# Patient Record
Sex: Female | Born: 1990 | Race: Black or African American | Hispanic: No | Marital: Single | Smoking: Former smoker
Health system: Southern US, Community
[De-identification: ages and names within clinical notes are randomized; demographics above are authoritative.]

## PROBLEM LIST (undated history)

## (undated) DIAGNOSIS — Z789 Other specified health status: Secondary | ICD-10-CM

## (undated) HISTORY — DX: Other specified health status: Z78.9

## (undated) HISTORY — PX: NO PAST SURGERIES: SHX2092

---

## 2008-01-06 ENCOUNTER — Emergency Department (HOSPITAL_COMMUNITY): Admission: EM | Admit: 2008-01-06 | Discharge: 2008-01-06 | Payer: Self-pay | Admitting: Emergency Medicine

## 2010-01-05 ENCOUNTER — Emergency Department (HOSPITAL_COMMUNITY): Admission: EM | Admit: 2010-01-05 | Discharge: 2010-01-05 | Payer: Self-pay | Admitting: Emergency Medicine

## 2011-05-10 ENCOUNTER — Emergency Department (HOSPITAL_COMMUNITY)
Admission: EM | Admit: 2011-05-10 | Discharge: 2011-05-10 | Discharge status: Against Medical Advice | Payer: Self-pay | Attending: Emergency Medicine | Admitting: Emergency Medicine

## 2011-05-10 ENCOUNTER — Emergency Department (HOSPITAL_COMMUNITY)
Admission: EM | Admit: 2011-05-10 | Discharge: 2011-05-10 | Disposition: A | Discharge status: Home / Self Care | Payer: No Typology Code available for payment source | Attending: Emergency Medicine | Admitting: Emergency Medicine

## 2011-05-10 ENCOUNTER — Encounter: Payer: Self-pay | Admitting: Emergency Medicine

## 2011-05-10 DIAGNOSIS — M62838 Other muscle spasm: Secondary | ICD-10-CM

## 2011-05-10 DIAGNOSIS — Z0389 Encounter for observation for other suspected diseases and conditions ruled out: Secondary | ICD-10-CM | POA: Insufficient documentation

## 2011-05-10 DIAGNOSIS — M542 Cervicalgia: Secondary | ICD-10-CM | POA: Insufficient documentation

## 2011-05-10 DIAGNOSIS — M549 Dorsalgia, unspecified: Secondary | ICD-10-CM | POA: Insufficient documentation

## 2011-05-10 MED ORDER — TRAMADOL HCL 50 MG PO TABS: 50.0000 mg | ORAL_TABLET | Freq: Four times a day (QID) | ORAL | Status: AC | PRN

## 2011-05-10 MED ORDER — DIAZEPAM 5 MG PO TABS: 5.0000 mg | ORAL_TABLET | Freq: Two times a day (BID) | ORAL | Status: AC

## 2011-05-10 MED ORDER — IBUPROFEN 800 MG PO TABS: 800.0000 mg | ORAL_TABLET | Freq: Three times a day (TID) | ORAL | Status: AC

## 2011-05-10 MED ORDER — IBUPROFEN 800 MG PO TABS
800.0000 mg | ORAL_TABLET | Freq: Once | ORAL | Status: AC
  Administered 2011-05-10: 800 mg via ORAL
  Filled 2011-05-10: qty 1

## 2011-05-10 NOTE — ED Provider Notes (Signed)
Evaluation and management procedures were performed by the PA/NP under my supervision/collaboration.   Macio Kissoon, MD 05/10/11 1614 
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
## 2013-05-25 DIAGNOSIS — A549 Gonococcal infection, unspecified: Secondary | ICD-10-CM

## 2013-05-25 HISTORY — DX: Gonococcal infection, unspecified: A54.9

## 2013-11-03 ENCOUNTER — Other Ambulatory Visit: Payer: Self-pay | Admitting: Family Medicine

## 2013-11-03 ENCOUNTER — Other Ambulatory Visit (HOSPITAL_COMMUNITY)
Admission: RE | Admit: 2013-11-03 | Discharge: 2013-11-03 | Disposition: A | Discharge status: Home / Self Care | Payer: BC Managed Care – PPO | Source: Ambulatory Visit | Attending: Family Medicine | Admitting: Family Medicine

## 2013-11-03 DIAGNOSIS — Z0000 Encounter for general adult medical examination without abnormal findings: Secondary | ICD-10-CM | POA: Insufficient documentation

## 2013-11-03 DIAGNOSIS — Z113 Encounter for screening for infections with a predominantly sexual mode of transmission: Secondary | ICD-10-CM | POA: Insufficient documentation

## 2013-11-07 LAB — CYTOLOGY - PAP

## 2014-05-12 ENCOUNTER — Encounter (HOSPITAL_COMMUNITY): Payer: Self-pay

## 2014-05-12 ENCOUNTER — Emergency Department (HOSPITAL_COMMUNITY)
Admission: EM | Admit: 2014-05-12 | Discharge: 2014-05-12 | Disposition: A | Discharge status: Home / Self Care | Payer: BC Managed Care – PPO | Attending: Emergency Medicine | Admitting: Emergency Medicine

## 2014-05-12 ENCOUNTER — Emergency Department (HOSPITAL_COMMUNITY): Payer: BC Managed Care – PPO

## 2014-05-12 DIAGNOSIS — S0990XA Unspecified injury of head, initial encounter: Secondary | ICD-10-CM | POA: Diagnosis not present

## 2014-05-12 DIAGNOSIS — R52 Pain, unspecified: Secondary | ICD-10-CM

## 2014-05-12 DIAGNOSIS — S199XXA Unspecified injury of neck, initial encounter: Secondary | ICD-10-CM | POA: Diagnosis present

## 2014-05-12 DIAGNOSIS — Y998 Other external cause status: Secondary | ICD-10-CM | POA: Insufficient documentation

## 2014-05-12 DIAGNOSIS — Y92410 Unspecified street and highway as the place of occurrence of the external cause: Secondary | ICD-10-CM | POA: Diagnosis not present

## 2014-05-12 DIAGNOSIS — Z87891 Personal history of nicotine dependence: Secondary | ICD-10-CM | POA: Insufficient documentation

## 2014-05-12 DIAGNOSIS — M542 Cervicalgia: Secondary | ICD-10-CM

## 2014-05-12 DIAGNOSIS — Y9389 Activity, other specified: Secondary | ICD-10-CM | POA: Diagnosis not present

## 2014-05-12 DIAGNOSIS — M79671 Pain in right foot: Secondary | ICD-10-CM

## 2014-05-12 DIAGNOSIS — S99921A Unspecified injury of right foot, initial encounter: Secondary | ICD-10-CM | POA: Diagnosis not present

## 2014-05-12 MED ORDER — HYDROCODONE-ACETAMINOPHEN 5-325 MG PO TABS
1.0000 | ORAL_TABLET | Freq: Once | ORAL | Status: AC
  Administered 2014-05-12: 1 via ORAL
  Filled 2014-05-12: qty 1

## 2014-05-12 MED ORDER — IBUPROFEN 800 MG PO TABS: 800.0000 mg | ORAL_TABLET | Freq: Three times a day (TID) | ORAL | Status: DC | PRN

## 2014-05-12 MED ORDER — CYCLOBENZAPRINE HCL 10 MG PO TABS: 10.0000 mg | ORAL_TABLET | Freq: Three times a day (TID) | ORAL | Status: DC | PRN

## 2014-05-12 MED ORDER — HYDROCODONE-ACETAMINOPHEN 5-325 MG PO TABS: 2.0000 | ORAL_TABLET | ORAL | Status: DC | PRN
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
## 2014-05-12 NOTE — Discharge Instructions (Signed)
Read the information below.  Use the prescribed medication as directed.  Please discuss all new medications with your pharmacist.  Do not take additional tylenol while taking the prescribed pain medication to avoid overdose.  You may return to the Emergency Department at any time for worsening condition or any new symptoms that concern you.  If there is any possibility that you might be pregnant, please let your health care provider know and discuss this with the pharmacist to ensure medication safety.   If you develop fevers, loss of control of bowel or bladder, weakness or numbness in your legs, or are unable to walk, return to the ER for a recheck.

## 2014-06-09 ENCOUNTER — Emergency Department (HOSPITAL_COMMUNITY)
Admission: EM | Admit: 2014-06-09 | Discharge: 2014-06-09 | Disposition: A | Discharge status: Home / Self Care | Payer: BLUE CROSS/BLUE SHIELD | Attending: Emergency Medicine | Admitting: Emergency Medicine

## 2014-06-09 ENCOUNTER — Encounter (HOSPITAL_COMMUNITY): Payer: Self-pay | Admitting: Cardiology

## 2014-06-09 DIAGNOSIS — R51 Headache: Secondary | ICD-10-CM | POA: Diagnosis present

## 2014-06-09 DIAGNOSIS — R519 Headache, unspecified: Secondary | ICD-10-CM

## 2014-06-09 DIAGNOSIS — H53149 Visual discomfort, unspecified: Secondary | ICD-10-CM | POA: Diagnosis not present

## 2014-06-09 DIAGNOSIS — Z87891 Personal history of nicotine dependence: Secondary | ICD-10-CM | POA: Insufficient documentation

## 2014-06-09 DIAGNOSIS — M62830 Muscle spasm of back: Secondary | ICD-10-CM | POA: Diagnosis not present

## 2014-06-09 MED ORDER — KETOROLAC TROMETHAMINE 30 MG/ML IJ SOLN
30.0000 mg | Freq: Once | INTRAMUSCULAR | Status: AC
  Administered 2014-06-09: 30 mg via INTRAVENOUS
  Filled 2014-06-09: qty 1

## 2014-06-09 MED ORDER — DIPHENHYDRAMINE HCL 50 MG/ML IJ SOLN
25.0000 mg | Freq: Once | INTRAMUSCULAR | Status: AC
  Administered 2014-06-09: 25 mg via INTRAVENOUS
  Filled 2014-06-09: qty 1

## 2014-06-09 MED ORDER — METOCLOPRAMIDE HCL 5 MG/ML IJ SOLN
10.0000 mg | Freq: Once | INTRAMUSCULAR | Status: AC
  Administered 2014-06-09: 10 mg via INTRAVENOUS
  Filled 2014-06-09: qty 2

## 2014-06-09 MED ORDER — METHOCARBAMOL 500 MG PO TABS: 500.0000 mg | ORAL_TABLET | Freq: Four times a day (QID) | ORAL | Status: DC

## 2014-06-09 NOTE — Discharge Instructions (Signed)
Please read and follow all provided instructions.  Your diagnoses today include:  1. Acute nonintractable headache, unspecified headache type   2. Back spasm     Tests performed today include:  Vital signs. See below for your results today.   Medications:  In the Emergency Department you received:  Reglan - antinausea/headache medication  Benadryl - antihistamine to counteract potential side effects of reglan  Toradol - NSAID medication similar to ibuprofen   Robaxin (methocarbamol) - muscle relaxer medication  DO NOT drive or perform any activities that require you to be awake and alert because this medicine can make you drowsy.   Take any prescribed medications only as directed.  Additional information:  Follow any educational materials contained in this packet.  You are having a headache. No specific cause was found today for your headache. It may have been a migraine or other cause of headache. Stress, anxiety, fatigue, and depression are common triggers for headaches.   Your headache today does not appear to be life-threatening or require hospitalization, but often the exact cause of headaches is not determined in the emergency department. Therefore, follow-up with your doctor is very important to find out what may have caused your headache and whether or not you need any further diagnostic testing or treatment.   Sometimes headaches can appear benign (not harmful), but then more serious symptoms can develop which should prompt an immediate re-evaluation by your doctor or the emergency department.  BE VERY CAREFUL not to take multiple medicines containing Tylenol (also called acetaminophen). Doing so can lead to an overdose which can damage your liver and cause liver failure and possibly death.   Follow-up instructions: Please follow-up with your primary care provider in the next 3 days for further evaluation of your symptoms.   Return instructions:   Please return to  the Emergency Department if you experience worsening symptoms.  Return if the medications do not resolve your headache, if it recurs, or if you have multiple episodes of vomiting or cannot keep down fluids.  Return if you have a change from the usual headache.  RETURN IMMEDIATELY IF you:  Develop a sudden, severe headache  Develop confusion or become poorly responsive or faint  Develop a fever above 100.71F or problem breathing  Have a change in speech, vision, swallowing, or understanding  Develop new weakness, numbness, tingling, incoordination in your arms or legs  Have a seizure  Please return if you have any other emergent concerns.  Additional Information:  Your vital signs today were: BP 123/79 mmHg   Pulse 87   Temp(Src) 97.4 F (36.3 C) (Oral)   Resp 18   SpO2 98% If your blood pressure (BP) was elevated above 135/85 this visit, please have this repeated by your doctor within one month. --------------
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
## 2016-06-13 ENCOUNTER — Encounter (HOSPITAL_COMMUNITY): Payer: Self-pay | Admitting: *Deleted

## 2016-06-13 ENCOUNTER — Ambulatory Visit (HOSPITAL_COMMUNITY)
Admission: EM | Admit: 2016-06-13 | Discharge: 2016-06-13 | Disposition: A | Discharge status: Home / Self Care | Payer: 59 | Attending: Family Medicine | Admitting: Family Medicine

## 2016-06-13 DIAGNOSIS — J111 Influenza due to unidentified influenza virus with other respiratory manifestations: Secondary | ICD-10-CM | POA: Diagnosis not present

## 2016-06-13 DIAGNOSIS — R5383 Other fatigue: Secondary | ICD-10-CM | POA: Diagnosis present

## 2016-06-13 DIAGNOSIS — R509 Fever, unspecified: Secondary | ICD-10-CM | POA: Diagnosis not present

## 2016-06-13 DIAGNOSIS — R52 Pain, unspecified: Secondary | ICD-10-CM | POA: Diagnosis present

## 2016-06-13 LAB — POCT RAPID STREP A: Streptococcus, Group A Screen (Direct): NEGATIVE

## 2016-06-13 MED ORDER — ACETAMINOPHEN 325 MG PO TABS
650.0000 mg | ORAL_TABLET | Freq: Once | ORAL | Status: AC
  Administered 2016-06-13: 650 mg via ORAL

## 2016-06-13 MED ORDER — ACETAMINOPHEN 325 MG PO TABS
ORAL_TABLET | ORAL | Status: AC
  Filled 2016-06-13: qty 2

## 2016-06-13 MED ORDER — OSELTAMIVIR PHOSPHATE 75 MG PO CAPS: 75.0000 mg | ORAL_CAPSULE | Freq: Two times a day (BID) | ORAL | 0 refills | Status: AC

## 2016-06-13 NOTE — ED Provider Notes (Signed)
CSN: [id number]     Arrival date & time 06/13/16  1214 History   First MD Initiated Contact with Patient 06/13/16 1411     No chief complaint on file.  (Consider location/radiation/quality/duration/timing/severity/associated sxs/prior Treatment) Patient is a well-appearing 26 year old female, with no medical history, presents today for flulike symptoms onset this morning. Patient reports a sudden onset of fever, chills, fatigue and body aches. Patient also endorses runny nose, coughing, abdominal pain, nausea and headache.  Her 73-year-old cousin who she lives with at home, was diagnosed with possible fluid and positive strep yesterday. She denies sore throat. Patient denies any alleviating or aggravating factors. Patient have not tired anything OTC to help.       History reviewed. No pertinent past medical history. History reviewed. No pertinent surgical history. History reviewed. No pertinent family history. Social History  Substance Use Topics  . Smoking status: Never Smoker  . Smokeless tobacco: Never Used  . Alcohol use No   OB History    No data available     Review of Systems  Constitutional: Positive for chills, fatigue and fever.  HENT: Positive for rhinorrhea. Negative for congestion, sinus pain, sinus pressure and sore throat.   Respiratory: Positive for cough. Negative for shortness of breath.   Cardiovascular: Negative for chest pain and palpitations.  Gastrointestinal: Positive for abdominal pain and nausea. Negative for vomiting.  Musculoskeletal: Positive for myalgias.  Neurological: Positive for headaches. Negative for dizziness.    Allergies  Patient has no known allergies.  Home Medications   Prior to Admission medications   Medication Sig Start Date End Date Taking? Authorizing Provider  oseltamivir (TAMIFLU) 75 MG capsule Take 1 capsule (75 mg total) by mouth every 12 (twelve) hours. 06/13/16 06/18/16  Lucia Estelle, NP   Meds Ordered and Administered this  Visit   Medications  acetaminophen (TYLENOL) tablet 650 mg (650 mg Oral Given 06/13/16 1357)    BP 115/73 (BP Location: Right Arm)   Pulse 110   Temp 102.8 F (39.3 C) (Oral)   Resp 18   LMP 05/30/2016   SpO2 98%  No data found.   Physical Exam  Constitutional: She is oriented to person, place, and time. She appears well-developed and well-nourished. No distress.  HENT:  Head: Normocephalic and atraumatic.  Right Ear: External ear normal.  Left Ear: External ear normal.  Nose: Nose normal.  Mouth/Throat: Oropharynx is clear and moist. No oropharyngeal exudate.  Unable to visualize the TM due to cerumen impaction bilaterally.  Eyes: Conjunctivae are normal. Pupils are equal, round, and reactive to light.  Neck: Normal range of motion. Neck supple.  Cardiovascular: Normal rate, regular rhythm and normal heart sounds.   Pulmonary/Chest: Effort normal and breath sounds normal. No respiratory distress.  Abdominal: Soft. Bowel sounds are normal.  Some generalized abdominal tenderness present on palpation.  Musculoskeletal: Normal range of motion.  Lymphadenopathy:    She has no cervical adenopathy.  Neurological: She is alert and oriented to person, place, and time.  Skin: Skin is warm and dry.  Nursing note and vitals reviewed.   Urgent Care Course     Procedures (including critical care time)  Labs Review Labs Reviewed  POCT RAPID STREP A    Imaging Review No results found.   MDM   1. Influenza    Rapid strep negative. Believed patient to have influenza. We will treat with Tamiflu twice a day 5 days. Emphasized on the importance of rest and oral hydration. Other over-the-counter treatments  discussed. Follow with PCP if no improvement is noted.    Lucia EstelleFeng Kahley Leib, NP 06/13/16 1454

## 2016-06-15 ENCOUNTER — Encounter (HOSPITAL_COMMUNITY): Payer: Self-pay | Admitting: Cardiology

## 2016-06-16 LAB — CULTURE, GROUP A STREP (THRC)

## 2016-09-24 DIAGNOSIS — R110 Nausea: Secondary | ICD-10-CM | POA: Diagnosis not present

## 2016-09-24 DIAGNOSIS — R1030 Lower abdominal pain, unspecified: Secondary | ICD-10-CM | POA: Diagnosis not present

## 2016-09-24 DIAGNOSIS — R8299 Other abnormal findings in urine: Secondary | ICD-10-CM | POA: Diagnosis not present

## 2016-09-29 DIAGNOSIS — N390 Urinary tract infection, site not specified: Secondary | ICD-10-CM | POA: Diagnosis not present

## 2016-09-29 DIAGNOSIS — Z0000 Encounter for general adult medical examination without abnormal findings: Secondary | ICD-10-CM | POA: Diagnosis not present

## 2016-09-29 DIAGNOSIS — G43909 Migraine, unspecified, not intractable, without status migrainosus: Secondary | ICD-10-CM | POA: Diagnosis not present

## 2016-09-29 DIAGNOSIS — E010 Iodine-deficiency related diffuse (endemic) goiter: Secondary | ICD-10-CM | POA: Diagnosis not present

## 2016-09-29 DIAGNOSIS — Z13220 Encounter for screening for lipoid disorders: Secondary | ICD-10-CM | POA: Diagnosis not present

## 2016-10-22 LAB — OB RESULTS CONSOLE ABO/RH: RH TYPE: POSITIVE

## 2016-10-22 LAB — OB RESULTS CONSOLE HEPATITIS B SURFACE ANTIGEN: HEP B S AG: NEGATIVE

## 2016-10-22 LAB — OB RESULTS CONSOLE RUBELLA ANTIBODY, IGM: RUBELLA: IMMUNE

## 2016-10-22 LAB — OB RESULTS CONSOLE HIV ANTIBODY (ROUTINE TESTING): HIV: NONREACTIVE

## 2016-10-22 LAB — OB RESULTS CONSOLE ANTIBODY SCREEN: Antibody Screen: NEGATIVE

## 2016-10-22 LAB — OB RESULTS CONSOLE RPR: RPR: NONREACTIVE

## 2016-11-17 LAB — OB RESULTS CONSOLE HIV ANTIBODY (ROUTINE TESTING): HIV: NONREACTIVE

## 2016-11-17 LAB — OB RESULTS CONSOLE HEPATITIS B SURFACE ANTIGEN: Hepatitis B Surface Ag: NEGATIVE

## 2016-11-17 LAB — OB RESULTS CONSOLE ABO/RH: RH TYPE: POSITIVE

## 2016-11-17 LAB — OB RESULTS CONSOLE GC/CHLAMYDIA
CHLAMYDIA, DNA PROBE: NEGATIVE
Gonorrhea: NEGATIVE

## 2016-11-17 LAB — OB RESULTS CONSOLE RPR: RPR: NONREACTIVE

## 2016-11-17 LAB — OB RESULTS CONSOLE ANTIBODY SCREEN: Antibody Screen: NEGATIVE

## 2016-11-17 LAB — OB RESULTS CONSOLE RUBELLA ANTIBODY, IGM: Rubella: IMMUNE

## 2016-11-26 DIAGNOSIS — Z87440 Personal history of urinary (tract) infections: Secondary | ICD-10-CM | POA: Diagnosis not present

## 2017-01-22 DIAGNOSIS — Z23 Encounter for immunization: Secondary | ICD-10-CM | POA: Diagnosis not present

## 2017-02-02 DIAGNOSIS — N760 Acute vaginitis: Secondary | ICD-10-CM | POA: Diagnosis not present

## 2017-03-17 ENCOUNTER — Telehealth: Payer: Self-pay

## 2017-03-17 LAB — OB RESULTS CONSOLE GBS: GBS: POSITIVE

## 2017-03-17 NOTE — Telephone Encounter (Signed)
Patient called reporting intermittent dizziness since 1300.  Diet recall shows patient with high fat ([employer] for Breakfast, [employer] for Lunch, Chips and soda for snack), low protein diet.  Instructed to increase protein via snacks and try to eat meals closer together.  Patient further instructed to properly hydrate and that she should be drinking 4 or more 16 oz bottles of water daily.  Patient verbalized understanding.  No other complaints. Instructed to call back if symptoms worsen or other symptoms noted.  JE, CNM

## 2017-03-25 DIAGNOSIS — N760 Acute vaginitis: Secondary | ICD-10-CM | POA: Diagnosis not present

## 2017-04-19 ENCOUNTER — Encounter (HOSPITAL_COMMUNITY): Payer: Self-pay | Admitting: *Deleted

## 2017-04-19 ENCOUNTER — Telehealth (HOSPITAL_COMMUNITY): Payer: Self-pay | Admitting: *Deleted

## 2017-04-19 NOTE — Telephone Encounter (Signed)
Preadmission screen  

## 2017-04-20 ENCOUNTER — Encounter (HOSPITAL_COMMUNITY): Payer: Self-pay | Admitting: *Deleted

## 2017-04-20 ENCOUNTER — Telehealth (HOSPITAL_COMMUNITY): Payer: Self-pay | Admitting: *Deleted

## 2017-04-20 NOTE — Telephone Encounter (Signed)
Preadmission screen  

## 2017-04-21 ENCOUNTER — Other Ambulatory Visit: Payer: Self-pay | Admitting: Obstetrics & Gynecology

## 2017-04-22 ENCOUNTER — Other Ambulatory Visit: Payer: Self-pay | Admitting: Obstetrics & Gynecology

## 2017-04-22 DIAGNOSIS — Z9889 Other specified postprocedural states: Secondary | ICD-10-CM

## 2017-04-22 NOTE — H&P (Signed)
HPI: 26 y/o G1P0 @ 10737w1d estimated gestational age (as dated by LMP c/w 20 week ultrasound) presents for scheduled IOL.   no Leaking of Fluid,   no Vaginal Bleeding,   no Uterine Contractions,  + Fetal Movement.  Prenatal care has been provided by Dr. Charlotta Newtonzan  ROS: no HA, no epigastric pain, no visual changes.    Pregnancy complicated by: 1) Anemia- on iron twice daily Last Hgb 9.6 (01/09/2017)  2) GBS positive- pt to have PCN in labor   Prenatal Transfer Tool  Maternal Diabetes: No Genetic Screening: Normal Maternal Ultrasounds/Referrals: Normal Fetal Ultrasounds or other Referrals:  None Maternal Substance Abuse:  No Significant Maternal Medications:  None Significant Maternal Lab Results: Lab values include: Group B Strep positive   PNL:  GBS positive, Rub Immune, Hep B neg, RPR NR, HIV neg, GC/C neg, glucola:normal Hgb: 9.6 Blood type: O positive, antibody neg  Immunizations: Tdap: 8/31 Flu: declined  OBHx: primip PMHx:  anemia Meds:  PNV, iron Allergy:  No Known Allergies SurgHx: none SocHx:   no Tobacco, no  EtOH, no Illicit Drugs  O: LMP 05/30/2016  Gen. AAOx3, NAD CV.  RRR  No murmur.  Resp. CTAB, no wheeze or crackles. Abd. Gravid,  no tenderness,  no rigidity,  no guarding Extr.  1+ edema B/L , no calf tenderness, neg Homan's B/L  FHT: reassuring by doppler and BPP in office on 11/29 SVE: closed/soft/-3, vertex   Labs: see orders  A/P:  26 y.o. G1P0 @ 6837w1d EGA who presents for scheduled IOL for postdates -FWB:  NICHD Cat I FHTs -Labor: plan for cytotec per protocol -GBS: positive, plan for PCN when in active labor or with rupture of membranes -Pain management: IV or epidural upon request -Anemia: CBC to be obtained  Myna HidalgoJennifer Daysi Boggan, DO [phone] (pager) [phone] (office)

## 2017-04-24 ENCOUNTER — Encounter (HOSPITAL_COMMUNITY): Payer: Self-pay

## 2017-04-24 ENCOUNTER — Encounter (HOSPITAL_COMMUNITY): Payer: Self-pay | Admitting: Certified Registered Nurse Anesthetist

## 2017-04-24 ENCOUNTER — Encounter (HOSPITAL_COMMUNITY)
Admission: RE | Disposition: A | Discharge status: Home / Self Care | Payer: Self-pay | Source: Ambulatory Visit | Attending: Obstetrics & Gynecology

## 2017-04-24 ENCOUNTER — Inpatient Hospital Stay (HOSPITAL_COMMUNITY): Payer: 59 | Admitting: Anesthesiology

## 2017-04-24 ENCOUNTER — Inpatient Hospital Stay (HOSPITAL_COMMUNITY)
Admission: RE | Admit: 2017-04-24 | Discharge: 2017-04-28 | DRG: 788 | Disposition: A | Discharge status: Home / Self Care | Payer: 59 | Source: Ambulatory Visit | Attending: Obstetrics & Gynecology | Admitting: Obstetrics & Gynecology

## 2017-04-24 DIAGNOSIS — Z3A41 41 weeks gestation of pregnancy: Secondary | ICD-10-CM | POA: Diagnosis not present

## 2017-04-24 DIAGNOSIS — O99824 Streptococcus B carrier state complicating childbirth: Secondary | ICD-10-CM | POA: Diagnosis present

## 2017-04-24 DIAGNOSIS — O9902 Anemia complicating childbirth: Secondary | ICD-10-CM | POA: Diagnosis present

## 2017-04-24 DIAGNOSIS — O480 Post-term pregnancy: Secondary | ICD-10-CM | POA: Diagnosis present

## 2017-04-24 DIAGNOSIS — D649 Anemia, unspecified: Secondary | ICD-10-CM | POA: Diagnosis present

## 2017-04-24 LAB — CBC
HEMATOCRIT: 34.5 % — AB (ref 36.0–46.0)
Hemoglobin: 11.6 g/dL — ABNORMAL LOW (ref 12.0–15.0)
MCH: 33.2 pg (ref 26.0–34.0)
MCHC: 33.6 g/dL (ref 30.0–36.0)
MCV: 98.9 fL (ref 78.0–100.0)
PLATELETS: 225 10*3/uL (ref 150–400)
RBC: 3.49 MIL/uL — ABNORMAL LOW (ref 3.87–5.11)
RDW: 15 % (ref 11.5–15.5)
WBC: 9.3 10*3/uL (ref 4.0–10.5)

## 2017-04-24 LAB — ABO/RH: ABO/RH(D): O POS

## 2017-04-24 LAB — TYPE AND SCREEN
ABO/RH(D): O POS
ANTIBODY SCREEN: NEGATIVE

## 2017-04-24 LAB — RPR: RPR Ser Ql: NONREACTIVE

## 2017-04-24 SURGERY — Surgical Case
Anesthesia: Epidural | Site: Abdomen | Wound class: Clean Contaminated

## 2017-04-24 MED ORDER — LACTATED RINGERS IV SOLN
INTRAVENOUS | Status: DC
  Administered 2017-04-24 (×4): via INTRAVENOUS

## 2017-04-24 MED ORDER — ONDANSETRON HCL 4 MG/2ML IJ SOLN
4.0000 mg | Freq: Four times a day (QID) | INTRAMUSCULAR | Status: DC | PRN
  Administered 2017-04-24: 4 mg via INTRAVENOUS
  Filled 2017-04-24: qty 2

## 2017-04-24 MED ORDER — OXYCODONE-ACETAMINOPHEN 5-325 MG PO TABS: 1.0000 | ORAL_TABLET | ORAL | Status: DC | PRN

## 2017-04-24 MED ORDER — EPHEDRINE 5 MG/ML INJ: 10.0000 mg | INTRAVENOUS | Status: DC | PRN

## 2017-04-24 MED ORDER — PHENYLEPHRINE 40 MCG/ML (10ML) SYRINGE FOR IV PUSH (FOR BLOOD PRESSURE SUPPORT): 80.0000 ug | PREFILLED_SYRINGE | INTRAVENOUS | Status: DC | PRN

## 2017-04-24 MED ORDER — DIPHENHYDRAMINE HCL 50 MG/ML IJ SOLN: 12.5000 mg | INTRAMUSCULAR | Status: DC | PRN

## 2017-04-24 MED ORDER — PENICILLIN G POTASSIUM 5000000 UNITS IJ SOLR
5.0000 10*6.[IU] | Freq: Once | INTRAVENOUS | Status: AC
  Administered 2017-04-24: 5 10*6.[IU] via INTRAVENOUS
  Filled 2017-04-24: qty 5

## 2017-04-24 MED ORDER — OXYCODONE-ACETAMINOPHEN 5-325 MG PO TABS: 2.0000 | ORAL_TABLET | ORAL | Status: DC | PRN

## 2017-04-24 MED ORDER — LACTATED RINGERS IV SOLN
500.0000 mL | Freq: Once | INTRAVENOUS | Status: AC
  Administered 2017-04-24: 500 mL via INTRAVENOUS

## 2017-04-24 MED ORDER — PHENYLEPHRINE 40 MCG/ML (10ML) SYRINGE FOR IV PUSH (FOR BLOOD PRESSURE SUPPORT)
80.0000 ug | PREFILLED_SYRINGE | INTRAVENOUS | Status: DC | PRN
  Filled 2017-04-24: qty 10

## 2017-04-24 MED ORDER — LIDOCAINE HCL (PF) 1 % IJ SOLN: 30.0000 mL | INTRAMUSCULAR | Status: DC | PRN

## 2017-04-24 MED ORDER — TERBUTALINE SULFATE 1 MG/ML IJ SOLN: 0.2500 mg | Freq: Once | INTRAMUSCULAR | Status: DC | PRN

## 2017-04-24 MED ORDER — PENICILLIN G POT IN DEXTROSE 60000 UNIT/ML IV SOLN
3.0000 10*6.[IU] | INTRAVENOUS | Status: DC
  Administered 2017-04-24 (×2): 3 10*6.[IU] via INTRAVENOUS
  Filled 2017-04-24 (×6): qty 50

## 2017-04-24 MED ORDER — OXYTOCIN BOLUS FROM INFUSION: 500.0000 mL | Freq: Once | INTRAVENOUS | Status: DC

## 2017-04-24 MED ORDER — OXYTOCIN 40 UNITS IN LACTATED RINGERS INFUSION - SIMPLE MED
2.5000 [IU]/h | INTRAVENOUS | Status: DC
  Filled 2017-04-24: qty 1000

## 2017-04-24 MED ORDER — TERBUTALINE SULFATE 1 MG/ML IJ SOLN
0.2500 mg | Freq: Once | INTRAMUSCULAR | Status: DC | PRN
Start: 2017-04-24 — End: 2017-04-25

## 2017-04-24 MED ORDER — OXYTOCIN 40 UNITS IN LACTATED RINGERS INFUSION - SIMPLE MED
1.0000 m[IU]/min | INTRAVENOUS | Status: DC
  Administered 2017-04-24: 2 m[IU]/min via INTRAVENOUS

## 2017-04-24 MED ORDER — FENTANYL 2.5 MCG/ML BUPIVACAINE 1/10 % EPIDURAL INFUSION (WH - ANES)
14.0000 mL/h | INTRAMUSCULAR | Status: DC | PRN
  Administered 2017-04-24: 14 mL/h via EPIDURAL
  Filled 2017-04-24: qty 100

## 2017-04-24 MED ORDER — LACTATED RINGERS IV SOLN: 500.0000 mL | Freq: Once | INTRAVENOUS | Status: DC

## 2017-04-24 MED ORDER — SOD CITRATE-CITRIC ACID 500-334 MG/5ML PO SOLN
30.0000 mL | ORAL | Status: DC | PRN
  Administered 2017-04-24: 30 mL via ORAL
  Filled 2017-04-24: qty 15

## 2017-04-24 MED ORDER — CEFAZOLIN SODIUM-DEXTROSE 2-4 GM/100ML-% IV SOLN: 2.0000 g | Freq: Once | INTRAVENOUS | Status: DC

## 2017-04-24 MED ORDER — LIDOCAINE HCL (PF) 1 % IJ SOLN
INTRAMUSCULAR | Status: DC | PRN
  Administered 2017-04-24 (×2): 5 mL via EPIDURAL

## 2017-04-24 MED ORDER — MISOPROSTOL 25 MCG QUARTER TABLET
25.0000 ug | ORAL_TABLET | ORAL | Status: DC | PRN
  Administered 2017-04-24 (×2): 25 ug via VAGINAL
  Filled 2017-04-24 (×3): qty 1

## 2017-04-24 MED ORDER — ACETAMINOPHEN 325 MG PO TABS: 650.0000 mg | ORAL_TABLET | ORAL | Status: DC | PRN

## 2017-04-24 MED ORDER — LACTATED RINGERS IV SOLN: 500.0000 mL | INTRAVENOUS | Status: DC | PRN

## 2017-04-24 SURGICAL SUPPLY — 37 items
BARRIER ADHS 3X4 INTERCEED (GAUZE/BANDAGES/DRESSINGS) ×2 IMPLANT
BENZOIN TINCTURE PRP APPL 2/3 (GAUZE/BANDAGES/DRESSINGS) ×2 IMPLANT
CHLORAPREP W/TINT 26ML (MISCELLANEOUS) ×2 IMPLANT
CLAMP CORD UMBIL (MISCELLANEOUS) IMPLANT
CLOTH BEACON ORANGE TIMEOUT ST (SAFETY) ×2 IMPLANT
DERMABOND ADVANCED (GAUZE/BANDAGES/DRESSINGS)
DERMABOND ADVANCED .7 DNX12 (GAUZE/BANDAGES/DRESSINGS) IMPLANT
DRSG OPSITE POSTOP 4X10 (GAUZE/BANDAGES/DRESSINGS) ×2 IMPLANT
ELECT REM PT RETURN 9FT ADLT (ELECTROSURGICAL) ×2
ELECTRODE REM PT RTRN 9FT ADLT (ELECTROSURGICAL) ×1 IMPLANT
EXTRACTOR VACUUM KIWI (MISCELLANEOUS) IMPLANT
GLOVE BIOGEL PI IND STRL 6.5 (GLOVE) ×1 IMPLANT
GLOVE BIOGEL PI IND STRL 7.0 (GLOVE) ×2 IMPLANT
GLOVE BIOGEL PI INDICATOR 6.5 (GLOVE) ×1
GLOVE BIOGEL PI INDICATOR 7.0 (GLOVE) ×2
GLOVE ECLIPSE 6.5 STRL STRAW (GLOVE) ×2 IMPLANT
GOWN STRL REUS W/TWL LRG LVL3 (GOWN DISPOSABLE) ×6 IMPLANT
KIT ABG SYR 3ML LUER SLIP (SYRINGE) IMPLANT
NEEDLE HYPO 25X5/8 SAFETYGLIDE (NEEDLE) IMPLANT
NS IRRIG 1000ML POUR BTL (IV SOLUTION) ×2 IMPLANT
PACK C SECTION WH (CUSTOM PROCEDURE TRAY) ×2 IMPLANT
PAD ABD 7.5X8 STRL (GAUZE/BANDAGES/DRESSINGS) ×2 IMPLANT
PAD OB MATERNITY 4.3X12.25 (PERSONAL CARE ITEMS) ×2 IMPLANT
PENCIL SMOKE EVAC W/HOLSTER (ELECTROSURGICAL) ×2 IMPLANT
RTRCTR C-SECT PINK 25CM LRG (MISCELLANEOUS) ×2 IMPLANT
STRIP CLOSURE SKIN 1/2X4 (GAUZE/BANDAGES/DRESSINGS) ×2 IMPLANT
SUT PLAIN 0 NONE (SUTURE) IMPLANT
SUT PLAIN 2 0 XLH (SUTURE) IMPLANT
SUT VIC AB 0 CT1 27 (SUTURE) ×2
SUT VIC AB 0 CT1 27XBRD ANBCTR (SUTURE) ×2 IMPLANT
SUT VIC AB 0 CTX 36 (SUTURE) ×3
SUT VIC AB 0 CTX36XBRD ANBCTRL (SUTURE) ×3 IMPLANT
SUT VIC AB 2-0 CT1 27 (SUTURE) ×1
SUT VIC AB 2-0 CT1 TAPERPNT 27 (SUTURE) ×1 IMPLANT
SUT VIC AB 4-0 KS 27 (SUTURE) ×2 IMPLANT
TOWEL OR 17X24 6PK STRL BLUE (TOWEL DISPOSABLE) ×2 IMPLANT
TRAY FOLEY BAG SILVER LF 14FR (SET/KITS/TRAYS/PACK) IMPLANT

## 2017-04-24 NOTE — Anesthesia Pain Management Evaluation Note (Signed)
  CRNA Pain Management Visit Note  Patient: Sharon Jimenez, 26 y.o., female  "Hello I am a member of the anesthesia team at [facility]. We have an anesthesia team available at all times to provide care throughout the hospital, including epidural management and anesthesia for C-section. I don't know your plan for the delivery whether it a natural birth, water birth, IV sedation, nitrous supplementation, doula or epidural, but we want to meet your pain goals."   1.Was your pain managed to your expectations on prior hospitalizations?   No prior hospitalizations  2.What is your expectation for pain management during this hospitalization?     Epidural  3.How can we help you reach that goal?   Record the patient's initial score and the patient's pain goal.   Pain: 4  Pain Goal: 6 [facility] wants you to be able to say your pain was always managed very well.  Sharon Jimenez,Sharon Jimenez 04/24/2017

## 2017-04-24 NOTE — Progress Notes (Signed)
OB PN:  S: Pt resting comfortably, no acute complaints  O: BP 119/84   Pulse 76   Temp 98.4 F (36.9 C) (Oral)   Resp 16   Ht 5\' 5"  (1.651 m)   Wt 83.5 kg (184 lb)   LMP 05/30/2016   SpO2 99%   BMI 30.62 kg/m   FHT: 125bpm, moderate variablity, + accels, no decels Toco: q2-715min SVE: 2/50/-3  Results for orders placed or performed during the hospital encounter of 04/24/17 (from the past 24 hour(s))  CBC     Status: Abnormal   Collection Time: 04/24/17 12:35 AM  Result Value Ref Range   WBC 9.3 4.0 - 10.5 K/uL   RBC 3.49 (L) 3.87 - 5.11 MIL/uL   Hemoglobin 11.6 (L) 12.0 - 15.0 g/dL   HCT 62.134.5 (L) 30.836.0 - 65.746.0 %   MCV 98.9 78.0 - 100.0 fL   MCH 33.2 26.0 - 34.0 pg   MCHC 33.6 30.0 - 36.0 g/dL   RDW 84.615.0 96.211.5 - 95.215.5 %   Platelets 225 150 - 400 K/uL  Type and screen     Status: None   Collection Time: 04/24/17 12:35 AM  Result Value Ref Range   ABO/RH(D) O POS    Antibody Screen NEG    Sample Expiration 04/27/2017   ABO/Rh     Status: None   Collection Time: 04/24/17 12:35 AM  Result Value Ref Range   ABO/RH(D) O POS     A/P: 26 y.o. G1P0 @ 4639w1d for IOL 1. FWB: Cat. I 2. Labor: plan to transition to Pitocin Pain: IV or epidural upon request GBS: positive, plan to start PCN  Myna HidalgoJennifer Maycen Degregory, DO [phone] (pager) [phone] (office)

## 2017-04-24 NOTE — Anesthesia Preprocedure Evaluation (Signed)
Anesthesia Evaluation  Patient identified by MRN, date of birth, ID band Patient awake    Reviewed: Allergy & Precautions, H&P , NPO status , Patient's Chart, lab work & pertinent test results  Airway Mallampati: II   Neck ROM: full    Dental   Pulmonary neg pulmonary ROS,    breath sounds clear to auscultation       Cardiovascular negative cardio ROS   Rhythm:regular Rate:Normal     Neuro/Psych    GI/Hepatic   Endo/Other    Renal/GU      Musculoskeletal   Abdominal   Peds  Hematology   Anesthesia Other Findings   Reproductive/Obstetrics (+) Pregnancy                             Anesthesia Physical Anesthesia Plan  ASA: I  Anesthesia Plan: Epidural   Post-op Pain Management:    Induction: Intravenous  PONV Risk Score and Plan: 2 and Treatment may vary due to age or medical condition  Airway Management Planned: Natural Airway  Additional Equipment:   Intra-op Plan:   Post-operative Plan:   Informed Consent: I have reviewed the patients History and Physical, chart, labs and discussed the procedure including the risks, benefits and alternatives for the proposed anesthesia with the patient or authorized representative who has indicated his/her understanding and acceptance.       Plan Discussed with: Anesthesiologist  Anesthesia Plan Comments:         Anesthesia Quick Evaluation  

## 2017-04-24 NOTE — Progress Notes (Signed)
OB PN:  S: Pt resting comfortably- starting to feel more regular contractions.  Rates her pain 5/10, no acute complaints  O: BP 122/73   Pulse 72   Temp 98.4 F (36.9 C) (Oral)   Resp 16   Ht 5\' 5"  (1.651 m)   Wt 83.5 kg (184 lb)   LMP 05/30/2016   SpO2 99%   BMI 30.62 kg/m   FHT: 130 bpm, moderate variablity, + accels, no decels Toco: q2-403min SVE: 3/70/-2, AROM clear fluid  A/P: 26 y.o. G1P0 @ 755w1d for IOL 1. FWB: Cat. I 2. Labor: continue pit per protocol Pain: IV or epidural upon request GBS: positive, continue PCN per protocol  Myna HidalgoJennifer Kaleigh Spiegelman, DO [phone] (pager) [phone] (office)

## 2017-04-24 NOTE — Anesthesia Procedure Notes (Signed)
Epidural Patient location during procedure: OB Start time: 04/24/2017 6:24 PM End time: 04/24/2017 6:35 PM  Staffing Anesthesiologist: Achille RichHodierne, Marlie Kuennen, MD Performed: anesthesiologist   Preanesthetic Checklist Completed: patient identified, site marked, pre-op evaluation, timeout performed, IV checked, risks and benefits discussed and monitors and equipment checked  Epidural Patient position: sitting Prep: DuraPrep Patient monitoring: heart rate, cardiac monitor, continuous pulse ox and blood pressure Approach: midline Location: L2-L3 Injection technique: LOR saline  Needle:  Needle type: Tuohy  Needle gauge: 17 G Needle length: 9 cm Needle insertion depth: 7 cm Catheter type: closed end flexible Catheter size: 19 Gauge Catheter at skin depth: 12 cm Test dose: negative and Other  Assessment Events: blood not aspirated, injection not painful, no injection resistance and negative IV test  Additional Notes Informed consent obtained prior to proceeding including risk of failure, 1% risk of PDPH, risk of minor discomfort and bruising.  Discussed rare but serious complications including epidural abscess, permanent nerve injury, epidural hematoma.  Discussed alternatives to epidural analgesia and patient desires to proceed.  Timeout performed pre-procedure verifying patient name, procedure, and platelet count.  Patient tolerated procedure well. Reason for block:procedure for pain

## 2017-04-24 NOTE — Progress Notes (Signed)
OB PN:  S: Pt resting comfortably with epidural  O: BP 126/62   Pulse 83   Temp 98.8 F (37.1 C) (Oral)   Resp 18   Ht 5\' 5"  (1.651 m)   Wt 83.5 kg (184 lb)   LMP 05/30/2016   SpO2 100%   BMI 30.62 kg/m   FHT: 130 bpm, moderate variablity, + accels, initially early decels noted now late decels x3 Toco: q2-223min SVE: 3-4/90/-2, concern for asynclitic presentation, IUPC placed  A/P: 26 y.o. G1P0 @ 6157w1d for IOL 1. FWB: Cat. II- pt repositioned, Pit discontinued  2. Labor: Pitocin off, due to FHT.  Discussed concern for arrest of dilation as pt has made no further cervical dilation since 12pm and currently with Pitocin, pt noted to have concerning decels.  Discussed options including primary C-section vs continuing labor.  Pt wishes to wait to see if adequate contraction and further cervical dilation for another hour before proceed with C-section.  Discussed that labor may require Pitocin which may cause fetal intolerance to labor.  Pt voiced understanding.  Plan to closely monitor Pain: continue epidural GBS: positive, continue PCN per protocol  Myna HidalgoJennifer Rachelann Enloe, DO [phone] (pager) [phone] (office)

## 2017-04-24 NOTE — Progress Notes (Signed)
At bedside to discuss care- Risk benefits and alternatives of cesarean section were discussed with the patient including but not limited to infection, bleeding, damage to bowel , bladder and baby with the need for further surgery. Pt voiced understanding and desires to proceed. Plan for primary C-section

## 2017-04-25 ENCOUNTER — Encounter (HOSPITAL_COMMUNITY): Payer: Self-pay | Admitting: Obstetrics & Gynecology

## 2017-04-25 LAB — CBC
HCT: 36.1 % (ref 36.0–46.0)
Hemoglobin: 11.8 g/dL — ABNORMAL LOW (ref 12.0–15.0)
MCH: 32.8 pg (ref 26.0–34.0)
MCHC: 32.7 g/dL (ref 30.0–36.0)
MCV: 100.3 fL — AB (ref 78.0–100.0)
PLATELETS: 220 10*3/uL (ref 150–400)
RBC: 3.6 MIL/uL — ABNORMAL LOW (ref 3.87–5.11)
RDW: 14.9 % (ref 11.5–15.5)
WBC: 16.4 10*3/uL — ABNORMAL HIGH (ref 4.0–10.5)

## 2017-04-25 MED ORDER — ZOLPIDEM TARTRATE 5 MG PO TABS: 5.0000 mg | ORAL_TABLET | Freq: Every evening | ORAL | Status: DC | PRN

## 2017-04-25 MED ORDER — SENNOSIDES-DOCUSATE SODIUM 8.6-50 MG PO TABS
2.0000 | ORAL_TABLET | ORAL | Status: DC
  Administered 2017-04-25 – 2017-04-28 (×3): 2 via ORAL
  Filled 2017-04-25 (×4): qty 2

## 2017-04-25 MED ORDER — OXYTOCIN 10 UNIT/ML IJ SOLN
INTRAMUSCULAR | Status: AC
  Filled 2017-04-25: qty 4

## 2017-04-25 MED ORDER — FENTANYL CITRATE (PF) 100 MCG/2ML IJ SOLN: 25.0000 ug | INTRAMUSCULAR | Status: DC | PRN

## 2017-04-25 MED ORDER — DIBUCAINE 1 % RE OINT: 1.0000 "application " | TOPICAL_OINTMENT | RECTAL | Status: DC | PRN

## 2017-04-25 MED ORDER — DIPHENHYDRAMINE HCL 25 MG PO CAPS: 25.0000 mg | ORAL_CAPSULE | Freq: Four times a day (QID) | ORAL | Status: DC | PRN

## 2017-04-25 MED ORDER — WITCH HAZEL-GLYCERIN EX PADS: 1.0000 "application " | MEDICATED_PAD | CUTANEOUS | Status: DC | PRN

## 2017-04-25 MED ORDER — SIMETHICONE 80 MG PO CHEW
80.0000 mg | CHEWABLE_TABLET | Freq: Three times a day (TID) | ORAL | Status: DC
  Administered 2017-04-25 – 2017-04-27 (×7): 80 mg via ORAL
  Filled 2017-04-25 (×7): qty 1

## 2017-04-25 MED ORDER — ONDANSETRON HCL 4 MG/2ML IJ SOLN: 4.0000 mg | Freq: Four times a day (QID) | INTRAMUSCULAR | Status: DC | PRN

## 2017-04-25 MED ORDER — OXYCODONE HCL 5 MG PO TABS: 5.0000 mg | ORAL_TABLET | ORAL | Status: DC | PRN

## 2017-04-25 MED ORDER — METOCLOPRAMIDE HCL 5 MG/ML IJ SOLN
INTRAMUSCULAR | Status: AC
Start: 2017-04-25 — End: ?
  Filled 2017-04-25: qty 2

## 2017-04-25 MED ORDER — DEXAMETHASONE SODIUM PHOSPHATE 10 MG/ML IJ SOLN
INTRAMUSCULAR | Status: DC | PRN
  Administered 2017-04-25: 10 mg via INTRAVENOUS

## 2017-04-25 MED ORDER — METOCLOPRAMIDE HCL 5 MG/ML IJ SOLN
INTRAMUSCULAR | Status: DC | PRN
  Administered 2017-04-25: 10 mg via INTRAVENOUS

## 2017-04-25 MED ORDER — COCONUT OIL OIL: 1.0000 "application " | TOPICAL_OIL | Status: DC | PRN

## 2017-04-25 MED ORDER — DEXAMETHASONE SODIUM PHOSPHATE 10 MG/ML IJ SOLN
INTRAMUSCULAR | Status: AC
  Filled 2017-04-25: qty 1

## 2017-04-25 MED ORDER — SODIUM CHLORIDE 0.9% FLUSH: 3.0000 mL | INTRAVENOUS | Status: DC | PRN

## 2017-04-25 MED ORDER — MENTHOL 3 MG MT LOZG: 1.0000 | LOZENGE | OROMUCOSAL | Status: DC | PRN

## 2017-04-25 MED ORDER — ONDANSETRON HCL 4 MG/2ML IJ SOLN
INTRAMUSCULAR | Status: AC
  Filled 2017-04-25: qty 2

## 2017-04-25 MED ORDER — DIPHENHYDRAMINE HCL 50 MG/ML IJ SOLN
INTRAMUSCULAR | Status: AC
  Filled 2017-04-25: qty 1

## 2017-04-25 MED ORDER — SCOPOLAMINE 1 MG/3DAYS TD PT72
MEDICATED_PATCH | TRANSDERMAL | Status: AC
  Filled 2017-04-25: qty 1

## 2017-04-25 MED ORDER — PRENATAL MULTIVITAMIN CH
1.0000 | ORAL_TABLET | Freq: Every day | ORAL | Status: DC
  Administered 2017-04-25 – 2017-04-27 (×3): 1 via ORAL
  Filled 2017-04-25 (×3): qty 1

## 2017-04-25 MED ORDER — MEPERIDINE HCL 25 MG/ML IJ SOLN
INTRAMUSCULAR | Status: AC
  Filled 2017-04-25: qty 1

## 2017-04-25 MED ORDER — NALOXONE HCL 0.4 MG/ML IJ SOLN: 0.4000 mg | INTRAMUSCULAR | Status: DC | PRN

## 2017-04-25 MED ORDER — MORPHINE SULFATE (PF) 0.5 MG/ML IJ SOLN
INTRAMUSCULAR | Status: DC | PRN
  Administered 2017-04-25: 4 mg via EPIDURAL

## 2017-04-25 MED ORDER — ACETAMINOPHEN 325 MG PO TABS: 650.0000 mg | ORAL_TABLET | ORAL | Status: DC | PRN

## 2017-04-25 MED ORDER — IBUPROFEN 600 MG PO TABS
600.0000 mg | ORAL_TABLET | Freq: Four times a day (QID) | ORAL | Status: DC
  Administered 2017-04-25 – 2017-04-28 (×13): 600 mg via ORAL
  Filled 2017-04-25 (×13): qty 1

## 2017-04-25 MED ORDER — OXYCODONE HCL 5 MG/5ML PO SOLN: 5.0000 mg | Freq: Once | ORAL | Status: DC | PRN

## 2017-04-25 MED ORDER — LACTATED RINGERS IV SOLN: INTRAVENOUS | Status: DC

## 2017-04-25 MED ORDER — MORPHINE SULFATE (PF) 0.5 MG/ML IJ SOLN
INTRAMUSCULAR | Status: AC
  Filled 2017-04-25: qty 10

## 2017-04-25 MED ORDER — LIDOCAINE-EPINEPHRINE 2 %-1:100000 IJ SOLN
INTRAMUSCULAR | Status: DC | PRN
  Administered 2017-04-25 (×2): 5 mL via INTRADERMAL

## 2017-04-25 MED ORDER — DIPHENHYDRAMINE HCL 50 MG/ML IJ SOLN: 12.5000 mg | INTRAMUSCULAR | Status: DC | PRN

## 2017-04-25 MED ORDER — KETOROLAC TROMETHAMINE 30 MG/ML IJ SOLN: 30.0000 mg | Freq: Four times a day (QID) | INTRAMUSCULAR | Status: AC | PRN

## 2017-04-25 MED ORDER — SIMETHICONE 80 MG PO CHEW
80.0000 mg | CHEWABLE_TABLET | ORAL | Status: DC
  Administered 2017-04-25 – 2017-04-28 (×3): 80 mg via ORAL
  Filled 2017-04-25 (×3): qty 1

## 2017-04-25 MED ORDER — OXYCODONE HCL 5 MG PO TABS: 5.0000 mg | ORAL_TABLET | Freq: Once | ORAL | Status: DC | PRN

## 2017-04-25 MED ORDER — SIMETHICONE 80 MG PO CHEW: 80.0000 mg | CHEWABLE_TABLET | ORAL | Status: DC | PRN

## 2017-04-25 MED ORDER — SCOPOLAMINE 1 MG/3DAYS TD PT72: 1.0000 | MEDICATED_PATCH | Freq: Once | TRANSDERMAL | Status: DC

## 2017-04-25 MED ORDER — NALOXONE HCL 0.4 MG/ML IJ SOLN
1.0000 ug/kg/h | INTRAVENOUS | Status: DC | PRN
  Filled 2017-04-25: qty 5

## 2017-04-25 MED ORDER — CEFAZOLIN SODIUM-DEXTROSE 2-3 GM-%(50ML) IV SOLR
INTRAVENOUS | Status: AC
  Filled 2017-04-25: qty 50

## 2017-04-25 MED ORDER — DIPHENHYDRAMINE HCL 25 MG PO CAPS
25.0000 mg | ORAL_CAPSULE | ORAL | Status: DC | PRN
  Filled 2017-04-25: qty 1

## 2017-04-25 MED ORDER — NALBUPHINE HCL 10 MG/ML IJ SOLN: 5.0000 mg | INTRAMUSCULAR | Status: DC | PRN

## 2017-04-25 MED ORDER — DIPHENHYDRAMINE HCL 50 MG/ML IJ SOLN
INTRAMUSCULAR | Status: DC | PRN
  Administered 2017-04-25: 25 mg via INTRAVENOUS

## 2017-04-25 MED ORDER — OXYCODONE HCL 5 MG PO TABS: 10.0000 mg | ORAL_TABLET | ORAL | Status: DC | PRN

## 2017-04-25 MED ORDER — OXYTOCIN 40 UNITS IN LACTATED RINGERS INFUSION - SIMPLE MED: 2.5000 [IU]/h | INTRAVENOUS | Status: AC

## 2017-04-25 MED ORDER — NALBUPHINE HCL 10 MG/ML IJ SOLN: 5.0000 mg | Freq: Once | INTRAMUSCULAR | Status: DC | PRN

## 2017-04-25 MED ORDER — ONDANSETRON HCL 4 MG/2ML IJ SOLN
INTRAMUSCULAR | Status: DC | PRN
  Administered 2017-04-25: 4 mg via INTRAVENOUS

## 2017-04-25 MED ORDER — ONDANSETRON HCL 4 MG/2ML IJ SOLN: 4.0000 mg | Freq: Three times a day (TID) | INTRAMUSCULAR | Status: DC | PRN

## 2017-04-25 MED ORDER — MEPERIDINE HCL 25 MG/ML IJ SOLN: 6.2500 mg | INTRAMUSCULAR | Status: DC | PRN

## 2017-04-25 MED ORDER — LACTATED RINGERS IV SOLN
INTRAVENOUS | Status: DC | PRN
  Administered 2017-04-25 (×3): via INTRAVENOUS

## 2017-04-25 MED ORDER — MEPERIDINE HCL 25 MG/ML IJ SOLN
INTRAMUSCULAR | Status: DC | PRN
  Administered 2017-04-25 (×2): 12.5 mg via INTRAVENOUS

## 2017-04-25 NOTE — Anesthesia Postprocedure Evaluation (Signed)
Anesthesia Post Note  Patient: Orland Decykeria L Sandy  Procedure(s) Performed: CESAREAN SECTION (N/A Abdomen)     Patient location during evaluation: Mother Baby Anesthesia Type: Epidural Level of consciousness: awake and alert and oriented Pain management: pain level controlled Vital Signs Assessment: post-procedure vital signs reviewed and stable Respiratory status: spontaneous breathing and nonlabored ventilation Cardiovascular status: stable Postop Assessment: no headache, adequate PO intake, no backache, epidural receding, patient able to bend at knees and no apparent nausea or vomiting Anesthetic complications: no    Last Vitals:  Vitals:   04/25/17 0430 04/25/17 0530  BP: (!) 157/90   Pulse: 60   Resp: 16 18  Temp: 37.1 C 37.2 C  SpO2: 98% 95%    Last Pain:  Vitals:   04/25/17 0600  TempSrc:   PainSc: 0-No pain   Pain Goal: Patients Stated Pain Goal: 0 (04/25/17 0235)               Laban EmperorMalinova,Isayah Ignasiak Hristova

## 2017-04-25 NOTE — Op Note (Signed)
PreOp Diagnosis:  -Intrauterine pregnancy @ 5814w2d -Failed induction of labor -Arrest of dilation -Fetal intolerance to labor PostOp Diagnosis: same and OP presentation Procedure: Primary C-section Surgeon: Dr. Myna HidalgoJennifer Keiarra Charon Assistant: Gerrit HeckJessica Emly, CNM Anesthesia: epidural Complications: none EBL: 557cc UOP: 400c Fluids: 2300cc  Findings: Female infant from OP presentation, normal uterus tubes and ovaries bilaterally  PROCEDURE:  Informed consent was obtained from the patient with risks, benefits, complications, treatment options, and expected outcomes discussed with the patient.  The patient concurred with the proposed plan, giving informed consent with form signed.   The patient was taken to Operating Room, and identified with the procedure verified as C-Section Delivery with Time Out. With induction of anesthesia, the patient was prepped and draped in the usual sterile fashion. A Pfannenstiel incision was made and carried down through the subcutaneous tissue to the fascia. The fascia was incised in the midline and extended transversely. The superior aspect of the fascial incision was grasped with Kochers elevated and the underlying muscle dissected off. The inferior aspect of the facial incision was in similar fashion, grasped elevated and rectus muscles dissected off. The peritoneum was identified and entered. Peritoneal incision was extended longitudinally. Alexis retractor was placed.  The utero-vesical peritoneal reflection was identified and incised transversely with the [facility] scissors, the incision extended laterally, the bladder flap created digitally. A low transverse uterine incision was made and the infants head delivered atraumatically. After the umbilical cord was clamped and cut cord blood was obtained for evaluation.   The placenta was removed intact and appeared normal. The uterine outline, tubes and ovaries appeared normal. The uterine incision was closed with running locked  sutures of 0 Vicryl and a second layer of the same stitch was used in an imbricating fashion.  Excellent hemostasis was obtained.  The pericolic gutters were then cleared of all clots and debris. Interceed was placed over the incision.  The peritoneum was closed in a running fashion.  The Alexis retractor was removed. The fascia was then reapproximated with running sutures of 0 Vicryl. The skin was closed with 4-0 vicryl in a subcuticular fashion.  Instrument, sponge, and needle counts were correct prior the abdominal closure and at the conclusion of the case. The patient was taken to recovery in stable condition.  Myna HidalgoJennifer Ellia Knowlton, DO [phone] (cell) [phone] (office)

## 2017-04-25 NOTE — Consult Note (Signed)
Neonatology Note:   Attendance at C-section:    I was asked by Dr. Ozan to attend this primary C/S at late term due to FTP. The mother is a G1, GBS + with aIAP and good prenatal care. ROM 8 hours before delivery, fluid clear. Infant vigorous with good spontaneous cry and tone. Needed only minimal bulb suctioning. Ap 8/9. Lungs clear to ausc in DR. Post-axial polydactaly noted RUE with small skin tag on LUE.  Parents informed.  To CN to care of Pediatrician.  Sharon Restivo C. Lynnsey Barbara, MD 

## 2017-04-25 NOTE — Lactation Note (Signed)
This note was copied from a baby's chart. Lactation Consultation Note  Patient Name: Boy Kandyce Rudykeria Birdsall AOZHY'QToday's Date: 04/25/2017 Reason for consult: Initial assessment;1st time breastfeeding;Term Breastfeeding consultation services and support information given and reviewed.  Observed baby at the breast after assist from RN.  Latch good and active feeding.  Instructed to feed with any cue and call for assist/concerns.  Maternal Data    Feeding Feeding Type: Breast Fed  LATCH Score Latch: Repeated attempts needed to sustain latch, nipple held in mouth throughout feeding, stimulation needed to elicit sucking reflex.  Audible Swallowing: None  Type of Nipple: Everted at rest and after stimulation  Comfort (Breast/Nipple): Soft / non-tender  Hold (Positioning): Assistance needed to correctly position infant at breast and maintain latch.  LATCH Score: 6  Interventions    Lactation Tools Discussed/Used     Consult Status Consult Status: Follow-up Date: 04/26/17 Follow-up type: In-patient    Huston FoleyMOULDEN, Adore Kithcart S 04/25/2017, 10:01 AM

## 2017-04-25 NOTE — Transfer of Care (Signed)
Immediate Anesthesia Transfer of Care Note  Patient: Orland Decykeria L Eldred  Procedure(s) Performed: CESAREAN SECTION (N/A Abdomen)  Patient Location: PACU  Anesthesia Type:Epidural  Level of Consciousness: awake, alert  and oriented  Airway & Oxygen Therapy: Patient Spontanous Breathing  Post-op Assessment: Report given to RN and Post -op Vital signs reviewed and stable  Post vital signs: Reviewed and stable  Last Vitals:  Vitals:   04/24/17 2330 04/24/17 2342  BP:  (!) 137/92  Pulse:  84  Resp:  18  Temp: 37.2 C   SpO2:      Last Pain:  Vitals:   04/24/17 2130  TempSrc: Oral  PainSc:       Patients Stated Pain Goal: 3 (04/24/17 0856)  Complications: No apparent anesthesia complications

## 2017-04-25 NOTE — Progress Notes (Signed)
Postoperative Note Day # 0  S:  Patient resting comfortable in bed.  Pain controlled.  Tolerating general diet. No flatus, no BM.  Lochia minimal.  Pt has not yet ambulated.  Foley in place  She denies n/v/f/c, SOB, or CP.  Pt plans on breastfeeding.  O: Temp:  [98.3 F (36.8 C)-99 F (37.2 C)] 98.9 F (37.2 C) (12/02 0945) Pulse Rate:  [57-94] 79 (12/02 0945) Resp:  [14-18] 18 (12/02 0945) BP: (112-157)/(61-93) 126/65 (12/02 0945) SpO2:  [94 %-100 %] 97 % (12/02 0945)   Gen: A&Ox3, NAD CV: RRR Resp: CTAB Abdomen: soft, NT, ND +BS Uterus: firm, non-tender, below umbilicus Incision: c/d/i, bandage on Ext: No edema, no calf tenderness bilaterally, SCDs in place  Labs:  Recent Labs    04/24/17 0035 04/25/17 0509  HGB 11.6* 11.8*    A/P: Pt is a 26 y.o. G1P1001 s/p primary C-section, POD #0  - Pain well controlled -GU: UOP is adequate, Foley to be removed later today -GI: Tolerating general diet -Activity: encouraged sitting up to chair and ambulation as tolerated -Prophylaxis: SCDs in place -Labs: stable as above -Baby boy circ to be completed  DISPO: Continue routine postoperative care  Myna HidalgoJennifer Madell Heino, DO [phone]) [phone] (office)

## 2017-04-26 NOTE — Progress Notes (Signed)
Subjective: Postpartum Day 1: Cesarean Delivery Patient reports tolerating PO, + flatus and no problems voiding.    Objective: Vital signs in last 24 hours: Temp:  [98.1 F (36.7 C)-98.4 F (36.9 C)] 98.1 F (36.7 C) (12/03 0526) Pulse Rate:  [64-84] 64 (12/03 0526) Resp:  [16] 16 (12/03 0526) BP: (122-123)/(62-79) 122/79 (12/03 0526) SpO2:  [100 %] 100 % (12/03 0526)  Physical Exam:  General: alert, cooperative and no distress Lochia: appropriate Uterine Fundus: firm Incision: healing well DVT Evaluation: No evidence of DVT seen on physical exam.  Recent Labs    04/24/17 0035 04/25/17 0509  HGB 11.6* 11.8*  HCT 34.5* 36.1    Assessment/Plan: Status post Cesarean section. Doing well postoperatively.  Continue current care  Patient desires circumcision of newborn prior to discharge  Dr. Charlotta Newtonzan to assume care tomorrow at 7 am .  Sharon Jimenez,Sharon Cremer J. 04/26/2017, 5:50 PM

## 2017-04-26 NOTE — Lactation Note (Signed)
This note was copied from a baby's chart. Lactation Consultation Note  Patient Name: Sharon Jimenez [id number]'s Date: 04/26/2017 Reason for consult: Follow-up assessment   Follow up with mo of 36 hour old infant. Infant with 9 BF for 10-45 minuets, 3 voids and 5 stools in the last 24 hours. infant weight 7 lb 10.8 oz with weight loss of 4%. LATCH score 7-10. Mom reports no pain with BF. Infant asleep on mom's chest.   Mom has given infant a bottle of formula. Mom reports infant did not like it. She is still trying to decide is she wants to continue formula. Mom reports she is tired and another reason she gave the formula. Discussed supply and demand and importance of frequent stimulation to the breast to promote milk supply.   Mom reports she has no questions/concerns. She reports she does not need LC assistance at this time.    Maternal Data Formula Feeding for Exclusion: No Has patient been taught Hand Expression?: Yes Does the patient have breastfeeding experience prior to this delivery?: No  Feeding Feeding Type: Breast Fed Length of feed: 45 min  LATCH Score Latch: Grasps breast easily, tongue down, lips flanged, rhythmical sucking.  Audible Swallowing: Spontaneous and intermittent  Type of Nipple: Everted at rest and after stimulation  Comfort (Breast/Nipple): Soft / non-tender  Hold (Positioning): No assistance needed to correctly position infant at breast.  LATCH Score: 10  Interventions    Lactation Tools Discussed/Used     Consult Status Consult Status: Follow-up Date: 04/27/17 Follow-up type: In-patient    Silas FloodSharon S Hice 04/26/2017, 1:08 PM

## 2017-04-27 ENCOUNTER — Other Ambulatory Visit: Payer: Self-pay

## 2017-04-27 ENCOUNTER — Encounter (HOSPITAL_COMMUNITY): Payer: Self-pay

## 2017-04-27 MED ORDER — BISACODYL 5 MG PO TBEC
5.0000 mg | DELAYED_RELEASE_TABLET | Freq: Every day | ORAL | Status: DC | PRN
  Filled 2017-04-27: qty 1

## 2017-04-27 MED ORDER — ACETAMINOPHEN FOR CIRCUMCISION 160 MG/5 ML: 40.0000 mg | ORAL | Status: DC | PRN

## 2017-04-27 MED ORDER — ACETAMINOPHEN FOR CIRCUMCISION 160 MG/5 ML: 40.0000 mg | Freq: Once | ORAL | Status: DC

## 2017-04-27 MED ORDER — SUCROSE 24% NICU/PEDS ORAL SOLUTION: 0.5000 mL | OROMUCOSAL | Status: DC | PRN

## 2017-04-27 MED ORDER — EPINEPHRINE TOPICAL FOR CIRCUMCISION 0.1 MG/ML: 1.0000 [drp] | TOPICAL | Status: DC | PRN

## 2017-04-27 MED ORDER — LIDOCAINE 1% INJECTION FOR CIRCUMCISION
0.8000 mL | INJECTION | Freq: Once | INTRAVENOUS | Status: DC
  Filled 2017-04-27: qty 1

## 2017-04-27 NOTE — Progress Notes (Signed)
Subjective: Postpartum Day 2: Cesarean Delivery Pt resting comfortably in bed.  Tolerating gen diet.  +flatus, no BM.  Ambulating and voiding without difficulty.  No F/C/CP/SOB.  Mild abdominal pain, well controlled with current medication  Objective: Vital signs in last 24 hours: Temp:  [98.3 F (36.8 C)] 98.3 F (36.8 C) (12/04 0615) Pulse Rate:  [63] 63 (12/04 0615) Resp:  [18] 18 (12/04 0615) BP: (131)/(77) 131/77 (12/04 0615)  Physical Exam:  General: alert, cooperative and no distress  CV: RRR Lungs: CTAB Abd: +BS, soft, non-tender, no rebound, no guarding Lochia: appropriate Uterine Fundus: firm, below umbilicus Incision: C/D/I with steris DVT Evaluation: No evidence of DVT seen on physical exam.  No edema bilaterally  CBC Latest Ref Rng & Units 04/25/2017 04/24/2017  WBC 4.0 - 10.5 K/uL 16.4(H) 9.3  Hemoglobin 12.0 - 15.0 g/dL 11.8(L) 11.6(L)  Hematocrit 36.0 - 46.0 % 36.1 34.5(L)  Platelets 150 - 400 K/uL 220 225    Assessment/Plan: Pt is a 26 y.o. G1P1001 s/p primary C-section, POD #2 - Pain well controlled -GU: voiding freely, encouraged stool softener  -GI: Tolerating general diet -Activity: encouraged sitting up to chair and ambulation as tolerated -Prophylaxis: SCDs in place -Labs: stable as above -Baby boy circ completed  DISPO: Continue routine postoperative care, plan for discharge home tomorrow  Myna HidalgoJennifer Haydan Mansouri, DO [phone] (cell) [phone] (office)        Alessandra BevelsJennifer M Tariq Pernell 04/27/2017, 6:37 AM

## 2017-04-28 ENCOUNTER — Encounter (HOSPITAL_COMMUNITY): Payer: Self-pay | Admitting: *Deleted

## 2017-04-28 MED ORDER — OXYCODONE HCL 5 MG PO TABS
5.0000 mg | ORAL_TABLET | Freq: Four times a day (QID) | ORAL | 0 refills | Status: DC | PRN
Start: 2017-04-28 — End: 2018-12-16

## 2017-04-28 MED ORDER — IBUPROFEN 600 MG PO TABS: 600.0000 mg | ORAL_TABLET | Freq: Four times a day (QID) | ORAL | 0 refills | Status: DC

## 2017-04-28 NOTE — Lactation Note (Addendum)
This note was copied from a baby's chart. Lactation Consultation Note  Patient Name: Boy Kandyce Rudykeria Yeates ZOXWR'UToday's Date: 04/28/2017 Reason for consult: Follow-up assessment  Baby 82 hours old. Mom reports that she BF baby for the first 2 days, but then decided that she wanted to give formula. Mom states that her breasts are starting to fill, and upon palpation they do feel full but not engorged. Reviewed engorgement prevention and treatment, and enc switching to a more comfortable bra without under-wire. Offered to assist mom with latching baby, but mom declined stating that she just finished giving baby 19 ml of formula and she wants baby to continue sleeping. Mom states that the baby is latching and nursing well, but at the end of the BF the baby does not want to let go of the breast--and then starts to chew and bite when she tries to remove him. Discuss how to remove baby from breast--breaking the seal by placing her finger in the baby's mouth. Reviewed the benefits of breast milk, and enc mom to call for assistance with the next feeding--mom has this LC's extension.   Mom aware of OP/BFSG and LC phone line assistance after D/C.   Maternal Data    Feeding Feeding Type: Formula  LATCH Score                   Interventions Interventions: Breast feeding basics reviewed  Lactation Tools Discussed/Used     Consult Status Consult Status: PRN    Sherlyn HayJennifer D Ramaya Guile 04/28/2017, 10:53 AM

## 2017-04-28 NOTE — Discharge Instructions (Signed)
Cesarean Delivery, Care After Refer to this sheet in the next few weeks. These instructions provide you with information about caring for yourself after your procedure. Your health care provider may also give you more specific instructions. Your treatment has been planned according to current medical practices, but problems sometimes occur. Call your health care provider if you have any problems or questions after your procedure. What can I expect after the procedure? After the procedure, it is common to have:  A small amount of blood or clear fluid coming from the incision.  Some redness, swelling, and pain in your incision area.  Some abdominal pain and soreness.  Vaginal bleeding (lochia).  Pelvic cramps.  Fatigue.  Follow these instructions at home: Incision care   Follow instructions from your health care provider about how to take care of your incision. Make sure you: ? Wash your hands with soap and water before you change your bandage (dressing). If soap and water are not available, use hand sanitizer. ? Change your dressing as told by your health care provider. ? Leave stitches (sutures), skin staples, skin glue, or adhesive strips in place. These skin closures may need to stay in place for 2 weeks or longer. If adhesive strip edges start to loosen and curl up, you may trim the loose edges. Do not remove adhesive strips completely unless your health care provider tells you to do that.  Check your incision area every day for signs of infection. Check for: ? More redness, swelling, or pain. ? More fluid or blood. ? Warmth. ? Pus or a bad smell.  When you cough or sneeze, hug a pillow. This helps with pain and decreases the chance of your incision opening up (dehiscing). Do this until your incision heals.  Medicines  Take over-the-counter and prescription medicines only as told by your health care provider.  For pain, take motrin (ibuprofen) every 6 hours for pain.  In  between you can take tylenol or severe pain, the oxycodone if needed.  This medicine may cause constipation so be cautious.  If you were prescribed an antibiotic medicine, take it as told by your health care provider. Do not stop taking the antibiotic until it is finished. Driving  Do not drive or operate heavy machinery while taking prescription pain medicine.  Do not drive for 24 hours if you received a sedative. Lifestyle  Do not drink alcohol. This is especially important if you are breastfeeding or taking pain medicine.  Do not use tobacco products, including cigarettes, chewing tobacco, or e-cigarettes. If you need help quitting, ask your health care provider. Tobacco can delay wound healing. Eating and drinking  Drink at least 8 eight-ounce glasses of water every day unless told not to by your health care provider. If you breastfeed, you may need to drink more water than this.  Eat high-fiber foods every day. These foods may help prevent or relieve constipation. High-fiber foods include: ? Whole grain cereals and breads. ? Brown rice. ? Beans. ? Fresh fruits and vegetables. Activity  Return to your normal activities as told by your health care provider. Ask your health care provider what activities are safe for you.  Rest as much as possible. Try to rest or take a nap while your baby is sleeping.  Do not lift anything that is heavier than your baby or 10 lb (4.5 kg) as told by your health care provider.  Ask your health care provider when you can engage in sexual activity. This may  depend on your: ? Risk of infection. ? Healing rate. ? Comfort and desire to engage in sexual activity. Bathing  Do not take baths, swim, or use a hot tub until your health care provider approves. Ask your health care provider if you can take showers. You may only be allowed to take sponge baths until your incision heals.  Keep your dressing dry as told by your health care provider. General  instructions  Do not use tampons or douches until your health care provider approves.  Wear: ? Loose, comfortable clothing. ? A supportive and well-fitting bra.  Watch for any blood clots that may pass from your vagina. These may look like clumps of dark red, brown, or black discharge.  Keep your perineum clean and dry as told by your health care provider.  Wipe from front to back when you use the toilet.  If possible, have someone help you care for your baby and help with household activities for a few days after you leave the hospital.  Keep all follow-up visits for you and your baby as told by your health care provider. This is important. Contact a health care provider if:  You have: ? Bad-smelling vaginal discharge. ? Difficulty urinating. ? Pain when urinating. ? A sudden increase or decrease in the frequency of your bowel movements. ? More redness, swelling, or pain around your incision. ? More fluid or blood coming from your incision. ? Pus or a bad smell coming from your incision. ? A fever. ? A rash. ? Little or no interest in activities you used to enjoy. ? Questions about caring for yourself or your baby. ? Nausea.  Your incision feels warm to the touch.  Your breasts turn red or become painful or hard.  You feel unusually sad or worried.  You vomit.  You pass large blood clots from your vagina. If you pass a blood clot, save it to show to your health care provider. Do not flush blood clots down the toilet without showing your health care provider.  You urinate more than usual.  You are dizzy or light-headed.  You have not breastfed and have not had a menstrual period for 12 weeks after delivery.  You stopped breastfeeding and have not had a menstrual period for 12 weeks after stopping breastfeeding. Get help right away if:  You have: ? Pain that does not go away or get better with medicine. ? Chest pain. ? Difficulty breathing. ? Blurred vision or  spots in your vision. ? Thoughts about hurting yourself or your baby. ? New pain in your abdomen or in one of your legs. ? A severe headache.  You faint.  You bleed from your vagina so much that you fill two sanitary pads in one hour. This information is not intended to replace advice given to you by your health care provider. Make sure you discuss any questions you have with your health care provider. Document Released: 01/31/2002 Document Revised: 09/19/2015 Document Reviewed: 04/15/2015 Elsevier Interactive Patient Education  2017 [employer]

## 2017-04-28 NOTE — Discharge Summary (Signed)
OB Discharge Summary     Patient Name: [facility] DOB: 05/20/1991 MRN: [id number]  Date of admission: 04/24/2017 Delivering MD: Myna HidalgoZAN, Skyelynn Rambeau   Date of discharge: 04/28/2017  Admitting diagnosis: INDUCTION Intrauterine pregnancy: 7182w5d     Secondary diagnosis:  Active Problems:   Labor and delivery, indication for care  Additional problems: Failed IOL, Arrest of dilation, Fetal intolerance to labor, OP presentation     Discharge diagnosis: Term Pregnancy Delivered                                                                                                Post partum procedures:none  Augmentation: AROM, Pitocin and Cytotec  Complications: None  Hospital course:  Induction of Labor With Cesarean Section  26 y.o. yo G1P0 at 282w5d was admitted to the hospital 04/24/2017 for induction of labor. Patient had a labor course significant for arrest of dilation and fetal intolerance to labor. The patient went for cesarean section due to Arrest of Dilation and Non-Reassuring FHR, and delivered a Viable infant,@BABYSUPPRESS (DBLINK,ept,110,,1,,) Membrane Rupture Time/Date: )4:32 PM ,04/24/2017   @Details  of operation can be found in separate operative Note.  Patient had an uncomplicated postpartum course. She is ambulating, tolerating a regular diet, passing flatus, and urinating well.  Patient is discharged home in stable condition on 04/28/17.                                    Physical exam  Vitals:   04/26/17 0526 04/27/17 0615 04/27/17 1949 04/28/17 0533  BP: 122/79 131/77 133/82 128/73  Pulse: 64 63 (!) 58 85  Resp: 16 18 18 18   Temp: 98.1 F (36.7 C) 98.3 F (36.8 C) 99.1 F (37.3 C) 98.3 F (36.8 C)  TempSrc: Oral Oral Oral Oral  SpO2: 100%   100%  Weight:      Height:       General: alert, cooperative and no distress  CV: RRR Lungs: CTAB Abd: soft, +BS Lochia: appropriate Uterine Fundus: firm Incision: Dressing is clean, dry, and intact DVT Evaluation: No  evidence of DVT seen on physical exam. Labs: Lab Results  Component Value Date   WBC 16.4 (H) 04/25/2017   HGB 11.8 (L) 04/25/2017   HCT 36.1 04/25/2017   MCV 100.3 (H) 04/25/2017   PLT 220 04/25/2017   No flowsheet data found.  Discharge instruction: per After Visit Summary and "Baby and Me Booklet".  After visit meds:  Allergies as of 04/28/2017   No Known Allergies     Medication List    STOP taking these medications   cephALEXin 500 MG capsule Commonly known as:  KEFLEX     TAKE these medications   ibuprofen 600 MG tablet Commonly known as:  ADVIL,MOTRIN Take 1 tablet (600 mg total) by mouth every 6 (six) hours.   INTEGRA 62.5-62.5-40-3 MG Caps Take 1 capsule by mouth daily.   oxyCODONE 5 MG immediate release tablet Commonly known as:  Oxy IR/ROXICODONE Take 1 tablet (5 mg total) by mouth every  6 (six) hours as needed for severe pain (pain scale 4-7).   prenatal multivitamin Tabs tablet Take 1 tablet by mouth daily at 12 noon.       Diet: routine diet  Activity: Advance as tolerated. Pelvic rest for 6 weeks.   Outpatient follow up:2 weeks Follow up Appt:No future appointments. Follow up Visit:No Follow-up on file.  Postpartum contraception: Undecided  Newborn Data: Live born female  Birth Weight: 8 lb (3629 g) APGAR: 8, 9  Newborn Delivery   Birth date/time:  04/25/2017 00:25:00 Delivery type:  C-Section, Low Transverse C-section categorization:  Primary     Baby Feeding: Bottle Disposition:home with mother   04/28/2017 [facility] SellerJennifer M Elisandra Deshmukh, DO

## 2017-12-12 DIAGNOSIS — T23261D Burn of second degree of back of right hand, subsequent encounter: Secondary | ICD-10-CM

## 2017-12-12 HISTORY — DX: Burn of second degree of back of right hand, subsequent encounter: T23.261D

## 2018-12-13 DIAGNOSIS — T300 Burn of unspecified body region, unspecified degree: Secondary | ICD-10-CM | POA: Insufficient documentation

## 2018-12-13 HISTORY — PX: SKIN GRAFT: SHX250

## 2018-12-16 ENCOUNTER — Encounter: Payer: Self-pay | Admitting: Family Medicine

## 2018-12-16 ENCOUNTER — Ambulatory Visit (INDEPENDENT_AMBULATORY_CARE_PROVIDER_SITE_OTHER): Payer: 59 | Admitting: Family Medicine

## 2018-12-16 ENCOUNTER — Other Ambulatory Visit: Payer: Self-pay

## 2018-12-16 VITALS — BP 120/70 | Ht 65.0 in | Wt 141.5 lb

## 2018-12-16 DIAGNOSIS — T23001A Burn of unspecified degree of right hand, unspecified site, initial encounter: Secondary | ICD-10-CM

## 2018-12-16 DIAGNOSIS — T300 Burn of unspecified body region, unspecified degree: Secondary | ICD-10-CM

## 2018-12-16 MED ORDER — SILVER SULFADIAZINE 1 % EX CREA: 1.0000 "application " | TOPICAL_CREAM | Freq: Every day | CUTANEOUS | 0 refills | Status: DC
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
## 2018-12-16 NOTE — Patient Instructions (Signed)
Burn Care, Adult A burn is an injury to the skin or the tissues under the skin. There are three types of burns:  First degree. These burns may cause the skin to be red and slightly swollen.  Second degree. These burns are very painful and cause the skin to be very red. The skin may also leak fluid, look shiny, and develop blisters.  Third degree. These burns cause permanent damage. They either turn the skin white or black and make it look charred, dry, and leathery. Taking care of your burn properly can help to prevent pain and infection. It can also help the burn to heal more quickly. What are the risks? Complications from burns include:  Damage to the skin.  Reduced blood flow near the injury.  Dead tissue.  Scarring.  Problems with movement, if the burn happened near a joint or on the hands or feet. Severe burns can lead to problems that affect the whole body, such as:  Fluid loss.  Less blood circulating in the body.  Inability to maintain a normal core body temperature (thermoregulation).  Infection.  Shock.  Problems breathing. How to care for a first-degree burn Right after a burn:  Rinse or soak the burn under cool water until the pain stops. Do not put ice on your burn. This can cause more damage.  Lightly cover the burn with a sterile cloth (dressing). Burn care  Follow instructions from your health care provider about: ? How to clean and take care of the burn. ? When to change and remove the dressing.  Check your burn every day for signs of infection. Check for: ? More redness, swelling, or pain. ? Warmth. ? Pus or a bad smell. Medicine  Take over-the-counter and prescription medicines only as told by your health care provider.  If you were prescribed antibiotic medicine, take or apply it as told by your health care provider. Do not stop using the antibiotic even if your condition improves. General instructions  To prevent infection, do not put  butter, oil, or other home remedies on your burn.  Do not rub your burn, even when you are cleaning it.  Protect your burn from the sun. How to care for a second-degree burn Right after a burn:  Rinse or soak the burn under cool water. Do this for several minutes. Do not put ice on your burn. This can cause more damage.  Lightly cover the burn with a sterile cloth (dressing). Burn care  Raise (elevate) the injured area above the level of your heart while sitting or lying down.  Follow instructions from your health care provider about: ? How to clean and take care of the burn. ? When to change and remove the dressing.  Check your burn every day for signs of infection. Check for: ? More redness, swelling, or pain. ? Warmth. ? Pus or a bad smell. Medicine   Take over-the-counter and prescription medicines only as told by your health care provider.  If you were prescribed antibiotic medicine, take or apply it as told by your health care provider. Do not stop using the antibiotic even if your condition improves. General instructions  To prevent infection: ? Do not put butter, oil, or other home remedies on the burn. ? Do not scratch or pick at the burn. ? Do not break any blisters. ? Do not peel skin.  Do not rub your burn, even when you are cleaning it.  Protect your burn from the sun. How   to care for a third-degree burn Right after a burn:  Lightly cover the burn with gauze.  Seek immediate medical attention. Burn care  Raise (elevate) the injured area above the level of your heart while sitting or lying down.  Drink enough fluid to keep your urine clear or pale yellow.  Rest as told by your health care provider. Do not participate in sports or other physical activities until your health care provider approves.  Follow instructions from your health care provider about: ? How to clean and take care of the burn. ? When to change and remove the dressing.  Check  your burn every day for signs of infection. Check for: ? More redness, swelling, or pain. ? Warmth. ? Pus or a bad smell. Medicine  Take over-the-counter and prescription medicines only as told by your health care provider.  If you were prescribed antibiotic medicine, take or apply it as told by your health care provider. Do not stop using the antibiotic even if your condition improves. General instructions  To prevent infection: ? Do not put butter, oil, or other home remedies on the burn. ? Do not scratch or pick at the burn. ? Do not break any blisters. ? Do not peel skin. ? Do not rub your burn, even when you are cleaning it.  Protect your burn from the sun.  Keep all follow-up visits as told by your health care provider. This is important. Contact a health care provider if:  Your condition does not improve.  Your condition gets worse.  You have a fever.  Your burn changes in appearance or develops black or red spots.  Your burn feels warm to the touch.  Your pain is not controlled with medicine. Get help right away if:  You have redness, swelling, or pain at the site of the burn.  You have fluid, blood, or pus coming from your burn.  You have red streaks near the burn.  You have severe pain. This information is not intended to replace advice given to you by your health care provider. Make sure you discuss any questions you have with your health care provider. Document Released: 05/11/2005 Document Revised: 04/23/2017 Document Reviewed: 10/29/2015 Elsevier Patient Education  2020 Elsevier Inc.  

## 2018-12-22 ENCOUNTER — Ambulatory Visit: Payer: 59 | Admitting: Physician Assistant

## 2019-10-18 ENCOUNTER — Telehealth: Payer: Self-pay

## 2019-10-18 ENCOUNTER — Ambulatory Visit
Admission: EM | Admit: 2019-10-18 | Discharge: 2019-10-18 | Disposition: A | Discharge status: Home / Self Care | Payer: 59 | Attending: Emergency Medicine | Admitting: Emergency Medicine

## 2019-10-18 DIAGNOSIS — R110 Nausea: Secondary | ICD-10-CM | POA: Diagnosis not present

## 2019-10-18 DIAGNOSIS — K2960 Other gastritis without bleeding: Secondary | ICD-10-CM

## 2019-10-18 MED ORDER — OMEPRAZOLE 20 MG PO CPDR: 20.0000 mg | DELAYED_RELEASE_CAPSULE | Freq: Every day | ORAL | 0 refills | Status: DC

## 2019-10-18 MED ORDER — ONDANSETRON 4 MG PO TBDP: 4.0000 mg | ORAL_TABLET | Freq: Three times a day (TID) | ORAL | 0 refills | Status: DC | PRN

## 2019-10-18 MED ORDER — ONDANSETRON 4 MG PO TBDP
4.0000 mg | ORAL_TABLET | Freq: Once | ORAL | Status: AC
  Administered 2019-10-18: 4 mg via ORAL
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
## 2019-10-18 NOTE — ED Triage Notes (Signed)
Pt c/o upper abdominal pain radiating to center of abdomen since 4am with nausea. States last ate chicken wings and salsa around 6pm yesterday.

## 2019-10-18 NOTE — Discharge Instructions (Signed)
Take reflux medication once daily. Schedule follow-up with PCP. May try bland diet as outlined in paperwork. May take Zofran under tongue (melt under tongue) up to every 8 hours as needed for nausea. Return for worsening pain, vomiting, fever.

## 2020-11-14 ENCOUNTER — Other Ambulatory Visit: Payer: Self-pay

## 2020-11-14 ENCOUNTER — Telehealth (INDEPENDENT_AMBULATORY_CARE_PROVIDER_SITE_OTHER): Payer: Medicaid Other | Admitting: *Deleted

## 2020-11-14 DIAGNOSIS — Z3490 Encounter for supervision of normal pregnancy, unspecified, unspecified trimester: Secondary | ICD-10-CM

## 2020-11-14 DIAGNOSIS — Z3A00 Weeks of gestation of pregnancy not specified: Secondary | ICD-10-CM

## 2020-11-14 MED ORDER — BLOOD PRESSURE KIT DEVI: 1.0000 | 0 refills | Status: DC | PRN

## 2020-11-14 MED ORDER — GOJJI WEIGHT SCALE MISC: 1.0000 | 0 refills | Status: DC | PRN

## 2020-11-14 NOTE — Progress Notes (Signed)
Patient was assessed and managed by nursing staff during this encounter. I have reviewed the chart and agree with the documentation and plan. I have also made any necessary editorial changes.  Cherre Robins, CNM 11/14/2020 12:56 PM

## 2020-11-14 NOTE — Patient Instructions (Signed)
  At our [facility], we work as an integrated team, providing care to address both physical and emotional health. Your medical provider may refer you to see our Behavioral Health Clinician [facility]) on the same day you see your medical provider, as availability permits; often scheduled virtually at your convenience.  Our [facility] is available to all patients, visits generally last between 20-30 minutes, but can be longer or shorter, depending on patient need. The [facility] offers help with stress management, coping with symptoms of depression and anxiety, major life changes , sleep issues, changing risky behavior, grief and loss, life stress, working on personal life goals, and  behavioral health issues, as these all affect your overall health and wellness.  The [facility] is NOT available for the following: FMLA paperwork, court-ordered evaluations, specialty assessments (custody or disability), letters to employers, or obtaining certification for an emotional support animal. The [facility] does not provide long-term therapy. You have the right to refuse integrated behavioral health services, or to reschedule to see the [facility] at a later date.  Confidentiality exception: If it is suspected that a child or disabled adult is being abused or neglected, we are required by law to report that to either Child Protective Services or Adult Management consultant.  If you have a diagnosis of Bipolar affective disorder, Schizophrenia, or recurrent Major depressive disorder, we will recommend that you establish care with a psychiatrist, as these are lifelong, chronic conditions, and we want your overall emotional health and medications to be more closely monitored. If you anticipate needing extended maternity leave due to mental health issues postpartum, it it recommended you inform your medical provider, so we can put in a referral to a psychiatrist as soon as possible. The [facility] is unable to recommend an extended maternity leave for mental  health issues. Your medical provider or [facility] may refer you to a therapist for ongoing, traditional therapy, or to a psychiatrist, for medication management, if it would benefit your overall health. Depending on your insurance, you may have a copay or be charged a deductible, depending on your insurance, to see the [facility]. If you are uninsured, it is recommended that you apply for financial assistance. (Forms may be requested at the front desk for in-person visits, via MyChart, or request a form during a virtual visit).  If you see the [facility] more than 6 times, you will have to complete a comprehensive clinical assessment interview with the [facility] to resume integrated services.  For virtual visits with the [facility], you must be physically in the state of [place] at the time of the visit. For example, if you live in [place], you will have to do an in-person visit with the [facility], and your out-of-state insurance may not cover behavioral health services in [place]. If you are going out of the state or country for any reason, the [facility] may see you virtually when you return to [place], but not while you are physically outside of [place].     [email or web address] is a Chief Technology Officer for information
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
## 2020-11-21 ENCOUNTER — Encounter: Payer: Medicaid Other | Admitting: Certified Nurse Midwife

## 2020-11-28 ENCOUNTER — Other Ambulatory Visit: Payer: Self-pay

## 2020-11-28 ENCOUNTER — Ambulatory Visit (INDEPENDENT_AMBULATORY_CARE_PROVIDER_SITE_OTHER): Payer: Medicaid Other

## 2020-11-28 ENCOUNTER — Other Ambulatory Visit (HOSPITAL_COMMUNITY)
Admission: RE | Admit: 2020-11-28 | Discharge: 2020-11-28 | Disposition: A | Discharge status: Home / Self Care | Payer: Medicaid Other | Source: Ambulatory Visit | Attending: Certified Nurse Midwife | Admitting: Certified Nurse Midwife

## 2020-11-28 VITALS — BP 118/77 | HR 99 | Wt 158.8 lb

## 2020-11-28 DIAGNOSIS — Z124 Encounter for screening for malignant neoplasm of cervix: Secondary | ICD-10-CM | POA: Insufficient documentation

## 2020-11-28 DIAGNOSIS — Z3490 Encounter for supervision of normal pregnancy, unspecified, unspecified trimester: Secondary | ICD-10-CM | POA: Diagnosis present

## 2020-11-28 DIAGNOSIS — N898 Other specified noninflammatory disorders of vagina: Secondary | ICD-10-CM

## 2020-11-28 DIAGNOSIS — Z3A10 10 weeks gestation of pregnancy: Secondary | ICD-10-CM | POA: Diagnosis present

## 2020-11-28 DIAGNOSIS — Z98891 History of uterine scar from previous surgery: Secondary | ICD-10-CM | POA: Insufficient documentation

## 2020-11-28 DIAGNOSIS — L732 Hidradenitis suppurativa: Secondary | ICD-10-CM

## 2020-11-28 MED ORDER — BLOOD PRESSURE KIT: 1.0000 | PACK | Freq: Once | 0 refills | Status: AC

## 2020-11-28 MED ORDER — PREPLUS 27-1 MG PO TABS: 1.0000 | ORAL_TABLET | Freq: Every day | ORAL | 13 refills | Status: AC

## 2020-11-28 MED ORDER — GOJJI WEIGHT SCALE MISC: 1.0000 | Freq: Once | 0 refills | Status: AC

## 2020-11-28 MED ORDER — ONDANSETRON 4 MG PO TBDP: 4.0000 mg | ORAL_TABLET | Freq: Four times a day (QID) | ORAL | 0 refills | Status: DC | PRN

## 2020-11-28 MED ORDER — METRONIDAZOLE 500 MG PO TABS: 500.0000 mg | ORAL_TABLET | Freq: Two times a day (BID) | ORAL | 0 refills | Status: DC
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
## 2020-11-29 LAB — CERVICOVAGINAL ANCILLARY ONLY
Bacterial Vaginitis (gardnerella): POSITIVE — AB
Candida Glabrata: NEGATIVE
Candida Vaginitis: POSITIVE — AB
Chlamydia: NEGATIVE
Comment: NEGATIVE
Comment: NEGATIVE
Comment: NEGATIVE
Comment: NEGATIVE
Comment: NEGATIVE
Comment: NORMAL
Neisseria Gonorrhea: NEGATIVE
Trichomonas: POSITIVE — AB

## 2020-11-29 LAB — CBC/D/PLT+RPR+RH+ABO+RUBIGG...
Antibody Screen: NEGATIVE
Basophils Absolute: 0 10*3/uL (ref 0.0–0.2)
Basos: 0 %
EOS (ABSOLUTE): 0.1 10*3/uL (ref 0.0–0.4)
Eos: 1 %
HCV Ab: <0.1 s/co ratio (ref 0.0–0.9)
HIV Screen 4th Generation wRfx: NONREACTIVE
Hematocrit: 32.6 % — ABNORMAL LOW (ref 34.0–46.6)
Hemoglobin: 11.1 g/dL (ref 11.1–15.9)
Hepatitis B Surface Ag: NEGATIVE
Immature Grans (Abs): 0 10*3/uL (ref 0.0–0.1)
Immature Granulocytes: 1 %
Lymphocytes Absolute: 2.8 10*3/uL (ref 0.7–3.1)
Lymphs: 32 %
MCH: 31.5 pg (ref 26.6–33.0)
MCHC: 34 g/dL (ref 31.5–35.7)
MCV: 93 fL (ref 79–97)
Monocytes Absolute: 0.5 10*3/uL (ref 0.1–0.9)
Monocytes: 6 %
Neutrophils Absolute: 5.2 10*3/uL (ref 1.4–7.0)
Neutrophils: 60 %
Platelets: 351 10*3/uL (ref 150–450)
RBC: 3.52 x10E6/uL — ABNORMAL LOW (ref 3.77–5.28)
RDW: 12.8 % (ref 11.7–15.4)
RPR Ser Ql: NONREACTIVE
Rh Factor: POSITIVE
Rubella Antibodies, IGG: 4.69 index (ref >0.99)
WBC: 8.6 10*3/uL (ref 3.4–10.8)

## 2020-11-29 LAB — HCV INTERPRETATION

## 2020-11-30 LAB — CULTURE, OB URINE

## 2020-11-30 LAB — URINE CULTURE, OB REFLEX

## 2020-12-02 ENCOUNTER — Other Ambulatory Visit: Payer: Self-pay | Admitting: Lactation Services

## 2020-12-02 ENCOUNTER — Telehealth: Payer: Self-pay | Admitting: Lactation Services

## 2020-12-02 MED ORDER — BLOOD PRESSURE KIT DEVI: 1.0000 | Freq: Once | 0 refills | Status: AC

## 2020-12-02 MED ORDER — GOJJI WEIGHT SCALE MISC: 1.0000 | 0 refills | Status: DC

## 2020-12-02 MED ORDER — BLOOD PRESSURE KIT DEVI: 1.0000 | Freq: Once | 0 refills | Status: DC

## 2020-12-02 MED ORDER — TERCONAZOLE 0.4 % VA CREA: 1.0000 | TOPICAL_CREAM | Freq: Every day | VAGINAL | 0 refills | Status: DC

## 2020-12-02 NOTE — Progress Notes (Signed)
[facility] and [facility] are not able to fill prescriptions for BP cuff or Scale. Prescription sent to [facility]. Called patient to inform her of change and that [facility] will be calling.

## 2020-12-02 NOTE — Telephone Encounter (Signed)
Called patient to let her know [facility] not able to fill BP and scale prescription and was resent to [facility] who will call patient.   Patient asked about results of recent swab. Reviewed and informed patient that she is + for Trich, BV and yeast. Reviewed starting ATB and 3-4 days later, starting the Terazol for yeast.   Reviewed partner needs to be notified and treated for trich. Advised no sexual intercourse for 2 weeks after both partners are treated. Advised partner needs to follow up with PCP or [facility]. Discussed with patient that she need to have a TOC at her next appointment.   Patient was given prescription for Flagyl at appt on 7/7. Terazol sent to pharmacy. Attempted to call patient again to discuss and voicemail not set up to leave message. Will send My Chart message.   Patient voiced understanding to all the above.

## 2020-12-05 LAB — CYTOLOGY - PAP
Comment: NEGATIVE
Diagnosis: NEGATIVE
Diagnosis: REACTIVE
High risk HPV: NEGATIVE

## 2020-12-11 ENCOUNTER — Encounter: Payer: Self-pay | Admitting: *Deleted

## 2021-01-15 ENCOUNTER — Telehealth: Payer: Self-pay

## 2021-01-15 NOTE — Progress Notes (Deleted)
Patient no show to appt. Possible transfer to another office!?  Will have front office call to reschedule.

## 2021-01-16 ENCOUNTER — Other Ambulatory Visit (HOSPITAL_COMMUNITY)
Admission: RE | Admit: 2021-01-16 | Discharge: 2021-01-16 | Disposition: A | Discharge status: Home / Self Care | Payer: Medicaid Other | Source: Ambulatory Visit

## 2021-01-16 ENCOUNTER — Other Ambulatory Visit: Payer: Self-pay

## 2021-01-16 ENCOUNTER — Ambulatory Visit (INDEPENDENT_AMBULATORY_CARE_PROVIDER_SITE_OTHER): Payer: Medicaid Other

## 2021-01-16 VITALS — BP 130/70 | HR 72 | Wt 159.8 lb

## 2021-01-16 DIAGNOSIS — Z8619 Personal history of other infectious and parasitic diseases: Secondary | ICD-10-CM | POA: Insufficient documentation

## 2021-01-16 DIAGNOSIS — Z3A17 17 weeks gestation of pregnancy: Secondary | ICD-10-CM

## 2021-01-16 DIAGNOSIS — Z98891 History of uterine scar from previous surgery: Secondary | ICD-10-CM

## 2021-01-16 DIAGNOSIS — Z3490 Encounter for supervision of normal pregnancy, unspecified, unspecified trimester: Secondary | ICD-10-CM | POA: Insufficient documentation
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
## 2021-01-18 LAB — AFP, SERUM, OPEN SPINA BIFIDA
AFP MoM: 1.53
AFP Value: 68 ng/mL
Gest. Age on Collection Date: 17.4 weeks
Maternal Age At EDD: 32.4 yr
OSBR Risk 1 IN: 5046
Test Results:: NEGATIVE
Weight: 159 [lb_av]

## 2021-01-20 LAB — CERVICOVAGINAL ANCILLARY ONLY
Comment: NEGATIVE
Trichomonas: NEGATIVE

## 2021-01-28 ENCOUNTER — Ambulatory Visit: Payer: Medicaid Other

## 2021-02-06 ENCOUNTER — Telehealth (INDEPENDENT_AMBULATORY_CARE_PROVIDER_SITE_OTHER): Payer: Medicaid Other

## 2021-02-06 DIAGNOSIS — Z98891 History of uterine scar from previous surgery: Secondary | ICD-10-CM

## 2021-02-06 DIAGNOSIS — Z3A20 20 weeks gestation of pregnancy: Secondary | ICD-10-CM

## 2021-02-06 DIAGNOSIS — Z8619 Personal history of other infectious and parasitic diseases: Secondary | ICD-10-CM

## 2021-02-06 DIAGNOSIS — O34219 Maternal care for unspecified type scar from previous cesarean delivery: Secondary | ICD-10-CM

## 2021-02-06 DIAGNOSIS — Z3490 Encounter for supervision of normal pregnancy, unspecified, unspecified trimester: Secondary | ICD-10-CM
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
## 2021-02-17 ENCOUNTER — Encounter: Payer: Self-pay | Admitting: Family Medicine

## 2021-02-17 ENCOUNTER — Ambulatory Visit: Payer: Medicaid Other

## 2021-02-17 ENCOUNTER — Ambulatory Visit (INDEPENDENT_AMBULATORY_CARE_PROVIDER_SITE_OTHER): Payer: Medicaid Other | Admitting: Family Medicine

## 2021-02-17 ENCOUNTER — Other Ambulatory Visit: Payer: Self-pay

## 2021-02-17 VITALS — BP 122/72 | HR 119 | Wt 162.6 lb

## 2021-02-17 DIAGNOSIS — Z3A21 21 weeks gestation of pregnancy: Secondary | ICD-10-CM

## 2021-02-17 DIAGNOSIS — Z3490 Encounter for supervision of normal pregnancy, unspecified, unspecified trimester: Secondary | ICD-10-CM | POA: Diagnosis not present

## 2021-02-17 DIAGNOSIS — L0231 Cutaneous abscess of buttock: Secondary | ICD-10-CM

## 2021-02-17 MED ORDER — CEPHALEXIN 500 MG PO CAPS: 500.0000 mg | ORAL_CAPSULE | Freq: Two times a day (BID) | ORAL | 0 refills | Status: AC
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
## 2021-02-17 NOTE — Progress Notes (Signed)
Pt reports fetal movement, and stinging pain in buttocks and perineum rating 10/10 that starting around 6 in the evening yesterday.

## 2021-02-18 ENCOUNTER — Other Ambulatory Visit: Payer: Self-pay | Admitting: *Deleted

## 2021-02-18 DIAGNOSIS — Z3689 Encounter for other specified antenatal screening: Secondary | ICD-10-CM

## 2021-02-18 DIAGNOSIS — Z362 Encounter for other antenatal screening follow-up: Secondary | ICD-10-CM

## 2021-02-18 DIAGNOSIS — Z3492 Encounter for supervision of normal pregnancy, unspecified, second trimester: Secondary | ICD-10-CM

## 2021-02-21 ENCOUNTER — Encounter: Payer: Medicaid Other | Admitting: Nurse Practitioner

## 2021-02-27 ENCOUNTER — Telehealth (INDEPENDENT_AMBULATORY_CARE_PROVIDER_SITE_OTHER): Payer: Medicaid Other | Admitting: Obstetrics

## 2021-02-27 ENCOUNTER — Telehealth: Payer: Medicaid Other | Admitting: Obstetrics

## 2021-02-27 ENCOUNTER — Encounter: Payer: Self-pay | Admitting: Obstetrics

## 2021-02-27 VITALS — BP 119/74

## 2021-02-27 DIAGNOSIS — Z3A22 22 weeks gestation of pregnancy: Secondary | ICD-10-CM

## 2021-02-27 DIAGNOSIS — O34219 Maternal care for unspecified type scar from previous cesarean delivery: Secondary | ICD-10-CM

## 2021-02-27 DIAGNOSIS — Z98891 History of uterine scar from previous surgery: Secondary | ICD-10-CM

## 2021-02-27 DIAGNOSIS — O99712 Diseases of the skin and subcutaneous tissue complicating pregnancy, second trimester: Secondary | ICD-10-CM

## 2021-02-27 DIAGNOSIS — Z8619 Personal history of other infectious and parasitic diseases: Secondary | ICD-10-CM

## 2021-02-27 DIAGNOSIS — L732 Hidradenitis suppurativa: Secondary | ICD-10-CM

## 2021-02-27 DIAGNOSIS — Z3490 Encounter for supervision of normal pregnancy, unspecified, unspecified trimester: Secondary | ICD-10-CM

## 2021-02-27 NOTE — Progress Notes (Signed)
OBSTETRICS PRENATAL VIRTUAL VISIT ENCOUNTER NOTE  Provider location: [facility]   Patient location: Home  I connected with Sharon Jimenez on 02/27/21 at  4:30 PM EDT by MyChart Video Encounter and verified that I am speaking with the correct person using two identifiers. I discussed the limitations, risks, security and privacy concerns of performing an evaluation and management service virtually and the availability of in person appointments. I also discussed with the patient that there may be a patient responsible charge related to this service. The patient expressed understanding and agreed to proceed. Subjective:  Sharon Jimenez is a 30 y.o. G2P1001 at [redacted]w[redacted]d being seen today for ongoing prenatal care.  She is currently monitored for the following issues for this low-risk pregnancy and has Burn; Supervision of low-risk pregnancy; Hidradenitis suppurativa; History of C-section; and History of trichomoniasis on their problem list.  Patient reports no complaints.   .  .   . Denies any leaking of fluid.   The following portions of the patient's history were reviewed and updated as appropriate: allergies, current medications, past family history, past medical history, past social history, past surgical history and problem list.   Objective:   Vitals:   02/27/21 1601  BP: 119/74    Fetal Status:           General:  Alert, oriented and cooperative. Patient is in no acute distress.  Respiratory: Normal respiratory effort, no problems with respiration noted  Mental Status: Normal mood and affect. Normal behavior. Normal judgment and thought content.  Rest of physical exam deferred due to type of encounter  Imaging: [place] MFM OB COMP + 14 WK  Result Date: 02/17/2021 ----------------------------------------------------------------------  OBSTETRICS REPORT                       (Signed Final 02/17/2021 02:04 pm)  ---------------------------------------------------------------------- Patient Info  ID #:       [id number]                          D.O.B.:  June 03, 1990 (30 yrs)  Name:       Sharon Jimenez               Visit Date: 02/17/2021 01:12 pm ---------------------------------------------------------------------- Performed By  Attending:        Ma Rings MD         Secondary Phy.:   [facility]                                                             [facility]  Performed By:     Emeline Darling BS,      Address:          [address]                    RD[facility]                                                             [place], [place]  [address]  Referred By:      Gerrit Heck           [facility]  Ref. Address:     Faculty Practice ---------------------------------------------------------------------- Orders  #  Description                           Code        Ordered By  1  [place] MFM OB COMP + 14 WK                [id number]    JESSICA EMLY ----------------------------------------------------------------------  #  Order #                     Accession #                Episode #  1  [id number]                   [id number]                 [id number] ---------------------------------------------------------------------- Indications  History of cesarean delivery, currently        O34.219  pregnant  [redacted] weeks gestation of pregnancy                Z3A.21  Encounter for antenatal screening for          Z36.3  malformations  Encounter for uncertain dates                  Z36.87  Low Risk NIPS(Negative Horizon) ---------------------------------------------------------------------- Fetal Evaluation  Num Of Fetuses:         1  Fetal Heart  Rate(bpm):  158  Cardiac Activity:       Observed  Presentation:           Breech  Placenta:               Posterior  P. Cord Insertion:      Visualized  Amniotic Fluid  AFI FV:      Within normal limits                              Largest Pocket(cm)  4.2 ---------------------------------------------------------------------- Biometry  BPD:      50.1  mm     G. Age:  21w 1d         44  %    CI:           72   %    70 - 86                                                          FL/HC:      18.6   %    15.9 - 20.3  HC:      187.9  mm     G. Age:  21w 1d         32  %    HC/AC:      1.13        1.06 - 1.25  AC:      166.1  mm     G. Age:  21w 4d         55  %    FL/BPD:     69.9   %  FL:         35  mm     G. Age:  21w 0d         32  %    FL/AC:      21.1   %    20 - 24  HUM:      33.3  mm     G. Age:  21w 2d         47  %  CER:      23.2  mm     G. Age:  21w 4d         71  %  CM:        3.3  mm  Est. FW:     416  gm    0 lb 15 oz      47  % ---------------------------------------------------------------------- OB History  Gravidity:    2         Term:   1        Prem:   0        SAB:   0  TOP:          0       Ectopic:  0        Living: 1 ---------------------------------------------------------------------- Gestational Age  LMP:           22w 1d        Date:  09/15/20                 EDD:   06/22/21  U/S Today:     21w 2d                                        EDD:   06/28/21  Best:          21w 2d     Det. By:  U/S (02/17/21)           EDD:   06/28/21 ---------------------------------------------------------------------- Anatomy  Cranium:               Appears normal  LVOT:                   Appears normal  Cavum:                 Appears normal         Aortic Arch:            Appears normal  Ventricles:            Appears normal         Ductal Arch:            Appears normal  Choroid Plexus:        Appears normal         Diaphragm:              Appears normal  Cerebellum:             Appears normal         Stomach:                Appears normal, left                                                                        sided  Posterior Fossa:       Appears normal         Abdomen:                Appears normal  Nuchal Fold:           Not applicable (>20    Abdominal Wall:         Appears nml (cord                         wks GA)                                        insert, abd wall)  Face:                  Appears normal         Cord Vessels:           Appears normal (3                         (orbits and profile)                           vessel cord)  Lips:                  Appears normal         Kidneys:                Appear normal  Palate:                Appears normal         Bladder:                Appears normal  Thoracic:              Appears normal  Spine:                  Appears normal  Heart:                 Appears normal         Upper Extremities:      Appears normal                         (4CH, axis, and                         situs)  RVOT:                  Not well visualized    Lower Extremities:      Appears normal  Other:  Fetus appears to be a female. Heels and Hands visualized. Technically          difficult due to fetal position. ---------------------------------------------------------------------- Cervix Uterus Adnexa  Cervix  Length:           3.31  cm.  Normal appearance by transabdominal scan.  Adnexa  No abnormality visualized. ---------------------------------------------------------------------- Comments  This patient was seen for a detailed fetal anatomy scan.  She denies any significant past medical history and denies  any problems in her current pregnancy.  She had a cell free DNA test earlier in her pregnancy which  indicated a low risk for trisomy 92, 24, and 13. A female fetus is  predicted.  Based on the fetal biometry measurements obtained today,  her EDC was changed to June 28, 2021, making her 21  weeks and 2 days pregnant today.  There were  no obvious fetal anomalies noted on today's  ultrasound exam.  Today's exam was limited due to the fetal  position.  The patient was informed that anomalies may be missed due  to technical limitations. If the fetus is in a suboptimal position  or maternal habitus is increased, visualization of the fetus in  the maternal uterus may be impaired.  A follow-up exam was scheduled in 4 weeks to confirm her  dates. ----------------------------------------------------------------------                   Ma Rings, MD Electronically Signed Final Report   02/17/2021 02:04 pm ----------------------------------------------------------------------   Assessment and Plan:  Pregnancy: G2P1001 at [redacted]w[redacted]d  1. Encounter for supervision of low-risk pregnancy, antepartum  2. History of C-section for failed IO:  arrest of dilatation and fetal intolerance of labor  3. Patient desires vaginal birth after cesarean section (VBAC)  4. History of trichomoniasis   5. Hidradenitis suppurativa - clinically stable    There are no diagnoses linked to this encounter. Preterm labor symptoms and general obstetric precautions including but not limited to vaginal bleeding, contractions, leaking of fluid and fetal movement were reviewed in detail with the patient. I discussed the assessment and treatment plan with the patient. The patient was provided an opportunity to ask questions and all were answered. The patient agreed with the plan and demonstrated an understanding of the instructions. The patient was advised to call back or seek an in-person office evaluation/go to [facility] at [facility] for any urgent or concerning symptoms. Please refer to After Visit Summary for other counseling recommendations.   I have spent a total of 15 minutes of non-face-to-face time, excluding clinical staff time, reviewing notes and preparing to see patient, ordering tests  and/or medications, and counseling the patient.   Return in  about 3 weeks (around 03/20/2021) for ROB, 2 hour OGTT.  Future Appointments  Date Time Provider Department Center  02/27/2021  4:30 PM Brock Bad, MD [facility] None  03/17/2021  3:30 PM [facility] NURSE [facility]  03/17/2021  3:45 PM [facility] US6 [facility]  03/20/2021  8:00 AM [facility] LAB [facility] None  03/20/2021  8:35 AM Constant, Gigi Gin, MD [facility] None    Coral Ceo, MD Center for [facility], [facility]  02/27/21

## 2021-03-17 ENCOUNTER — Other Ambulatory Visit: Payer: Self-pay

## 2021-03-17 ENCOUNTER — Ambulatory Visit: Payer: Medicaid Other | Attending: Obstetrics

## 2021-03-17 ENCOUNTER — Ambulatory Visit: Payer: Medicaid Other | Admitting: *Deleted

## 2021-03-17 ENCOUNTER — Encounter: Payer: Self-pay | Admitting: *Deleted

## 2021-03-17 VITALS — BP 104/53 | HR 92

## 2021-03-17 DIAGNOSIS — Z8619 Personal history of other infectious and parasitic diseases: Secondary | ICD-10-CM

## 2021-03-17 DIAGNOSIS — Z3689 Encounter for other specified antenatal screening: Secondary | ICD-10-CM | POA: Diagnosis present

## 2021-03-17 DIAGNOSIS — O34219 Maternal care for unspecified type scar from previous cesarean delivery: Secondary | ICD-10-CM

## 2021-03-17 DIAGNOSIS — Z3A25 25 weeks gestation of pregnancy: Secondary | ICD-10-CM

## 2021-03-17 DIAGNOSIS — Z362 Encounter for other antenatal screening follow-up: Secondary | ICD-10-CM | POA: Diagnosis not present

## 2021-03-17 DIAGNOSIS — Z3490 Encounter for supervision of normal pregnancy, unspecified, unspecified trimester: Secondary | ICD-10-CM | POA: Insufficient documentation

## 2021-03-17 DIAGNOSIS — Z3492 Encounter for supervision of normal pregnancy, unspecified, second trimester: Secondary | ICD-10-CM | POA: Insufficient documentation

## 2021-03-18 ENCOUNTER — Other Ambulatory Visit: Payer: Self-pay | Admitting: *Deleted

## 2021-03-18 DIAGNOSIS — Z3689 Encounter for other specified antenatal screening: Secondary | ICD-10-CM

## 2021-03-20 ENCOUNTER — Ambulatory Visit (INDEPENDENT_AMBULATORY_CARE_PROVIDER_SITE_OTHER): Payer: Medicaid Other | Admitting: Obstetrics and Gynecology

## 2021-03-20 ENCOUNTER — Other Ambulatory Visit: Payer: Self-pay

## 2021-03-20 ENCOUNTER — Other Ambulatory Visit: Payer: Medicaid Other

## 2021-03-20 ENCOUNTER — Encounter: Payer: Self-pay | Admitting: Obstetrics and Gynecology

## 2021-03-20 VITALS — BP 110/70 | HR 112 | Wt 166.6 lb

## 2021-03-20 DIAGNOSIS — Z3A25 25 weeks gestation of pregnancy: Secondary | ICD-10-CM

## 2021-03-20 DIAGNOSIS — Z98891 History of uterine scar from previous surgery: Secondary | ICD-10-CM

## 2021-03-20 DIAGNOSIS — Z3490 Encounter for supervision of normal pregnancy, unspecified, unspecified trimester: Secondary | ICD-10-CM

## 2021-03-20 NOTE — Progress Notes (Signed)
Pt presents for ROB without complaints today.  

## 2021-03-20 NOTE — Progress Notes (Signed)
   PRENATAL VISIT NOTE  Subjective:  Sharon Jimenez is a 30 y.o. G2P1001 at [redacted]w[redacted]d being seen today for ongoing prenatal care.  She is currently monitored for the following issues for this low-risk pregnancy and has Burn; Supervision of low-risk pregnancy; Hidradenitis suppurativa; History of C-section; and History of trichomoniasis on their problem list.  Patient reports no complaints.  Contractions: Not present. Vag. Bleeding: None.  Movement: Present. Denies leaking of fluid.   The following portions of the patient's history were reviewed and updated as appropriate: allergies, current medications, past family history, past medical history, past social history, past surgical history and problem list.   Objective:   Vitals:   03/20/21 0814  BP: 110/70  Pulse: (!) 112  Weight: 166 lb 9.6 oz (75.6 kg)    Fetal Status: Fetal Heart Rate (bpm): 141  Fundal Height: 25 cm Movement: Present     General:  Alert, oriented and cooperative. Patient is in no acute distress.  Skin: Skin is warm and dry. No rash noted.   Cardiovascular: Normal heart rate noted  Respiratory: Normal respiratory effort, no problems with respiration noted  Abdomen: Soft, gravid, appropriate for gestational age.  Pain/Pressure: Absent     Pelvic: Cervical exam deferred        Extremities: Normal range of motion.  Edema: Trace  Mental Status: Normal mood and affect. Normal behavior. Normal judgment and thought content.   Assessment and Plan:  Pregnancy: G2P1001 at [redacted]w[redacted]d 1. Encounter for supervision of low-risk pregnancy, antepartum Patient is doing well without complaints Third trimester labs next visit Follow up growth ultrasound in November due to SGA  2. [redacted] weeks gestation of pregnancy   3. History of C-section Desires TOALC- will sign form at next visit  Preterm labor symptoms and general obstetric precautions including but not limited to vaginal bleeding, contractions, leaking of fluid and fetal  movement were reviewed in detail with the patient. Please refer to After Visit Summary for other counseling recommendations.   Return in about 3 weeks (around 04/10/2021) for in person, ROB, Low risk.  Future Appointments  Date Time Provider Department Center  03/20/2021  8:35 AM Yousif Edelson, Gigi Gin, MD [facility] None  04/10/2021  8:00 AM [facility] LAB [facility] None  04/10/2021  8:15 AM Hermina Staggers, MD [facility] None  04/14/2021  3:30 PM [facility] NURSE [facility]  04/14/2021  3:45 PM [facility] US5 [facility]    Catalina Antigua, MD

## 2021-04-10 ENCOUNTER — Other Ambulatory Visit: Payer: Medicaid Other

## 2021-04-10 ENCOUNTER — Encounter: Payer: Self-pay | Admitting: Obstetrics and Gynecology

## 2021-04-10 ENCOUNTER — Ambulatory Visit (INDEPENDENT_AMBULATORY_CARE_PROVIDER_SITE_OTHER): Payer: Medicaid Other | Admitting: Obstetrics and Gynecology

## 2021-04-10 ENCOUNTER — Other Ambulatory Visit: Payer: Self-pay

## 2021-04-10 VITALS — BP 123/76 | HR 103 | Wt 171.0 lb

## 2021-04-10 DIAGNOSIS — Z98891 History of uterine scar from previous surgery: Secondary | ICD-10-CM

## 2021-04-10 DIAGNOSIS — Z3490 Encounter for supervision of normal pregnancy, unspecified, unspecified trimester: Secondary | ICD-10-CM

## 2021-04-10 NOTE — Progress Notes (Signed)
Subjective:  Sharon Jimenez is a 30 y.o. G2P1001 at [redacted]w[redacted]d being seen today for ongoing prenatal care.  She is currently monitored for the following issues for this low-risk pregnancy and has Supervision of low-risk pregnancy; Hidradenitis suppurativa; History of C-section; and History of trichomoniasis on their problem list.  Patient reports no complaints.  Contractions: Not present. Vag. Bleeding: None.  Movement: Present. Denies leaking of fluid.   The following portions of the patient's history were reviewed and updated as appropriate: allergies, current medications, past family history, past medical history, past social history, past surgical history and problem list. Problem list updated.  Objective:   Vitals:   04/10/21 0828  BP: 123/76  Pulse: (!) 103  Weight: 171 lb (77.6 kg)    Fetal Status: Fetal Heart Rate (bpm): 155   Movement: Present     General:  Alert, oriented and cooperative. Patient is in no acute distress.  Skin: Skin is warm and dry. No rash noted.   Cardiovascular: Normal heart rate noted  Respiratory: Normal respiratory effort, no problems with respiration noted  Abdomen: Soft, gravid, appropriate for gestational age. Pain/Pressure: Present     Pelvic:  Cervical exam deferred        Extremities: Normal range of motion.  Edema: Trace  Mental Status: Normal mood and affect. Normal behavior. Normal judgment and thought content.   Urinalysis:      Assessment and Plan:  Pregnancy: G2P1001 at [redacted]w[redacted]d  1. Encounter for supervision of low-risk pregnancy, antepartum Stable Glucola today Declined TDAP and Flu vaccine Growth scan next week  2. History of C-section TOLAC consent obtained   Preterm labor symptoms and general obstetric precautions including but not limited to vaginal bleeding, contractions, leaking of fluid and fetal movement were reviewed in detail with the patient. Please refer to After Visit Summary for other counseling recommendations.  Return  in about 2 weeks (around 04/24/2021) for OB visit, face to face, any provider.   Hermina Staggers, MD

## 2021-04-10 NOTE — Patient Instructions (Signed)

## 2021-04-11 LAB — CBC
Hematocrit: 28.3 % — ABNORMAL LOW (ref 34.0–46.6)
Hemoglobin: 9.6 g/dL — ABNORMAL LOW (ref 11.1–15.9)
MCH: 32.1 pg (ref 26.6–33.0)
MCHC: 33.9 g/dL (ref 31.5–35.7)
MCV: 95 fL (ref 79–97)
Platelets: 301 10*3/uL (ref 150–450)
RBC: 2.99 x10E6/uL — ABNORMAL LOW (ref 3.77–5.28)
RDW: 13 % (ref 11.7–15.4)
WBC: 13.6 10*3/uL — ABNORMAL HIGH (ref 3.4–10.8)

## 2021-04-11 LAB — GLUCOSE TOLERANCE, 2 HOURS W/ 1HR
Glucose, 1 hour: 118 mg/dL (ref 70–179)
Glucose, 2 hour: 73 mg/dL (ref 70–152)
Glucose, Fasting: 77 mg/dL (ref 70–91)

## 2021-04-11 LAB — HIV ANTIBODY (ROUTINE TESTING W REFLEX): HIV Screen 4th Generation wRfx: NONREACTIVE

## 2021-04-11 LAB — RPR: RPR Ser Ql: NONREACTIVE

## 2021-04-14 ENCOUNTER — Other Ambulatory Visit: Payer: Self-pay | Admitting: *Deleted

## 2021-04-14 ENCOUNTER — Other Ambulatory Visit: Payer: Self-pay

## 2021-04-14 ENCOUNTER — Encounter: Payer: Self-pay | Admitting: *Deleted

## 2021-04-14 ENCOUNTER — Ambulatory Visit: Payer: Medicaid Other | Attending: Obstetrics

## 2021-04-14 ENCOUNTER — Ambulatory Visit: Payer: Medicaid Other | Admitting: *Deleted

## 2021-04-14 VITALS — BP 109/62 | HR 98

## 2021-04-14 DIAGNOSIS — Z3A29 29 weeks gestation of pregnancy: Secondary | ICD-10-CM | POA: Diagnosis not present

## 2021-04-14 DIAGNOSIS — Z8619 Personal history of other infectious and parasitic diseases: Secondary | ICD-10-CM

## 2021-04-14 DIAGNOSIS — Z362 Encounter for other antenatal screening follow-up: Secondary | ICD-10-CM | POA: Diagnosis not present

## 2021-04-14 DIAGNOSIS — O365990 Maternal care for other known or suspected poor fetal growth, unspecified trimester, not applicable or unspecified: Secondary | ICD-10-CM

## 2021-04-14 DIAGNOSIS — Z3689 Encounter for other specified antenatal screening: Secondary | ICD-10-CM | POA: Diagnosis present

## 2021-05-13 ENCOUNTER — Other Ambulatory Visit: Payer: Self-pay

## 2021-05-13 ENCOUNTER — Ambulatory Visit (INDEPENDENT_AMBULATORY_CARE_PROVIDER_SITE_OTHER): Payer: Medicaid Other | Admitting: Obstetrics and Gynecology

## 2021-05-13 VITALS — BP 118/71 | HR 106 | Wt 179.1 lb

## 2021-05-13 DIAGNOSIS — Z3A33 33 weeks gestation of pregnancy: Secondary | ICD-10-CM | POA: Insufficient documentation

## 2021-05-13 DIAGNOSIS — Z98891 History of uterine scar from previous surgery: Secondary | ICD-10-CM

## 2021-05-13 DIAGNOSIS — Z3493 Encounter for supervision of normal pregnancy, unspecified, third trimester: Secondary | ICD-10-CM

## 2021-05-13 NOTE — Progress Notes (Signed)
° °  PRENATAL VISIT NOTE  Subjective:  Sharon Jimenez is a 30 y.o. G2P1001 at [redacted]w[redacted]d being seen today for ongoing prenatal care.  She is currently monitored for the following issues for this low-risk pregnancy and has Supervision of low-risk pregnancy; Hidradenitis suppurativa; History of C-section; History of trichomoniasis; and [redacted] weeks gestation of pregnancy on their problem list.  Patient doing well with no acute concerns today. She reports no complaints.  Contractions: Not present. Vag. Bleeding: None.  Movement: Present. Denies leaking of fluid.   We again reviewed VBAC risks, consent signed and on chart, but pt given copy to review on her own.  The following portions of the patient's history were reviewed and updated as appropriate: allergies, current medications, past family history, past medical history, past social history, past surgical history and problem list. Problem list updated.  Objective:   Vitals:   05/13/21 1330  BP: 118/71  Pulse: (!) 106  Weight: 179 lb 1.6 oz (81.2 kg)    Fetal Status: Fetal Heart Rate (bpm): 142 Fundal Height: 33 cm Movement: Present     General:  Alert, oriented and cooperative. Patient is in no acute distress.  Skin: Skin is warm and dry. No rash noted.   Cardiovascular: Normal heart rate noted  Respiratory: Normal respiratory effort, no problems with respiration noted  Abdomen: Soft, gravid, appropriate for gestational age.  Pain/Pressure: Present     Pelvic: Cervical exam deferred        Extremities: Normal range of motion.  Edema: Trace  Mental Status:  Normal mood and affect. Normal behavior. Normal judgment and thought content.   Assessment and Plan:  Pregnancy: G2P1001 at [redacted]w[redacted]d  1. [redacted] weeks gestation of pregnancy   2. Encounter for supervision of low-risk pregnancy in third trimester Continue routine care  3. History of C-section Pt desires TOLAC, consent previously signed and on chart, pt has VBAC form to review again on her  own.  Preterm labor symptoms and general obstetric precautions including but not limited to vaginal bleeding, contractions, leaking of fluid and fetal movement were reviewed in detail with the patient.  Please refer to After Visit Summary for other counseling recommendations.   Return in about 2 weeks (around 05/27/2021) for [facility], in person.   Mariel Aloe, MD Faculty Attending Center for [facility]

## 2021-05-13 NOTE — Progress Notes (Signed)
ROB 33.3 wks No unusual complaints. Hgb/Hct 9.6/28.3 04/10/21

## 2021-05-25 NOTE — L&D Delivery Note (Signed)
OB/GYN Faculty Practice Delivery Note  Sharon Jimenez is a 31 y.o. G2P1001 s/p SVD at [redacted]w[redacted]d. She was admitted for SOL/TOLAC.   ROM: 0h 73m with scant bloody fluid GBS Status: Negative/-- (01/16 1111) Maximum Maternal Temperature: 99.85F  Labor Progress: Initial SVE: 2/80/-3. She then progressed to complete with overall expectant management and AROM.    Delivery Date/Time: 07/02/2021 at 1637 Delivery: Called to room and patient was complete and pushing. Head delivered L OA. Loose nuchal cord present and delivered through. Shoulder and body delivered in usual fashion. Infant with spontaneous cry, placed on mother's abdomen, dried and stimulated. Cord clamped x 2 after 30 second delay as placenta delivered completely during this time, and cut by grandmother. Suspect likely terminal placental abruption with increased bloody show shortly before delivery and placenta already released after infant. Cord blood drawn. Fundus firm with massage, lower uterine segment sweep, and Pitocin. Labia, perineum, vagina, and cervix inspected with bilateral peri-urethral lacerations and a left vaginal wall abrasion. The left peri-urethral and vaginal wall tears were repaired in usual fashion with 4.0 Monocryl SH. The right peri-urethral was small/hemostatic and not repaired.    Baby Weight: pending  Placenta: 3 vessel, intact. Sent to L&D Complications: Suspected placenta abruption at time of delivery  Lacerations: bilateral peri-urethral lacerations, left vaginal wall  EBL: 347 mL Analgesia: Epidural   Infant:  APGAR (1 MIN): 9   APGAR (5 MINS): [place], DO  OB Family Medicine Fellow, [facility] for [facility] 07/02/2021, 5:19 PM

## 2021-05-29 ENCOUNTER — Other Ambulatory Visit: Payer: Self-pay

## 2021-05-29 ENCOUNTER — Ambulatory Visit: Payer: 59 | Admitting: *Deleted

## 2021-05-29 ENCOUNTER — Ambulatory Visit: Payer: 59 | Attending: Obstetrics

## 2021-05-29 VITALS — BP 123/66 | HR 115

## 2021-05-29 DIAGNOSIS — Z3493 Encounter for supervision of normal pregnancy, unspecified, third trimester: Secondary | ICD-10-CM | POA: Insufficient documentation

## 2021-05-29 DIAGNOSIS — Z362 Encounter for other antenatal screening follow-up: Secondary | ICD-10-CM | POA: Diagnosis not present

## 2021-05-29 DIAGNOSIS — Z3A35 35 weeks gestation of pregnancy: Secondary | ICD-10-CM | POA: Insufficient documentation

## 2021-05-29 DIAGNOSIS — O34219 Maternal care for unspecified type scar from previous cesarean delivery: Secondary | ICD-10-CM | POA: Diagnosis not present

## 2021-05-29 DIAGNOSIS — Z8619 Personal history of other infectious and parasitic diseases: Secondary | ICD-10-CM | POA: Insufficient documentation

## 2021-05-29 DIAGNOSIS — O365930 Maternal care for other known or suspected poor fetal growth, third trimester, not applicable or unspecified: Secondary | ICD-10-CM

## 2021-05-29 DIAGNOSIS — O365993 Maternal care for other known or suspected poor fetal growth, unspecified trimester, fetus 3: Secondary | ICD-10-CM | POA: Diagnosis not present

## 2021-05-29 DIAGNOSIS — O365990 Maternal care for other known or suspected poor fetal growth, unspecified trimester, not applicable or unspecified: Secondary | ICD-10-CM | POA: Diagnosis present

## 2021-05-30 ENCOUNTER — Ambulatory Visit (INDEPENDENT_AMBULATORY_CARE_PROVIDER_SITE_OTHER): Payer: Medicaid Other

## 2021-05-30 VITALS — BP 126/76 | HR 110 | Wt 178.4 lb

## 2021-05-30 DIAGNOSIS — Z3A35 35 weeks gestation of pregnancy: Secondary | ICD-10-CM

## 2021-05-30 DIAGNOSIS — Z3490 Encounter for supervision of normal pregnancy, unspecified, unspecified trimester: Secondary | ICD-10-CM

## 2021-05-30 NOTE — Progress Notes (Signed)
ROB 35.6 wks  Reports edema

## 2021-05-30 NOTE — Progress Notes (Signed)
° °  LOW-RISK PREGNANCY OFFICE VISIT  Patient name: Sharon Jimenez MRN [id number]  Date of birth: 1991/01/06 Chief Complaint:   Routine Prenatal Visit  Subjective:   Sharon Jimenez is a 31 y.o. G44P1001 female at [redacted]w[redacted]d with an Estimated Date of Delivery: 06/28/21 being seen today for ongoing management of a Low-risk pregnancy aeb has Supervision of low-risk pregnancy; Hidradenitis suppurativa; History of C-section; History of trichomoniasis; and [redacted] weeks gestation of pregnancy on their problem list.  Patient presents today with  pelvic pressure .  Patient endorses fetal movement. Patient reports some abdominal contractions.  Patient denies vaginal concerns including abnormal discharge, leaking of fluid, and bleeding.  Patient expresses some fear surrounding anticipated delivery.     Contractions: Not present. Vag. Bleeding: None.  Movement: Present.  Reviewed past medical,surgical, social, obstetrical and family history as well as problem list, medications and allergies.  Objective   Vitals:   05/30/21 0937  BP: 126/76  Pulse: (!) 110  Weight: 178 lb 6.4 oz (80.9 kg)  Body mass index is 29.69 kg/m.  Total Weight Gain:33 lb 6.4 oz (15.2 kg)         Physical Examination:   General appearance: Well appearing, and in no distress  Mental status: Alert, oriented to person, place, and time  Skin: Warm & dry  Cardiovascular: Normal heart rate noted  Respiratory: Normal respiratory effort, no distress  Abdomen: Soft, gravid, nontender, AGA with Fundal Height: 36 cm  Pelvic: Cervical exam deferred           Extremities: Edema: Mild pitting, slight indentation  Fetal Status: Fetal Heart Rate (bpm): 145  Movement: Present   No results found for this or any previous visit (from the past 24 hour(s)).  Assessment & Plan:  Low-risk pregnancy of a 31 y.o., G2P1001 at 109w6d with an Estimated Date of Delivery: 06/28/21   1. Encounter for supervision of low-risk pregnancy,  antepartum -Anticipatory guidance for upcoming appts. -Patient to schedule next appt in 1 weeks for an in-person visit. -Educated on GBS bacteria including what it is, why we test, and how and when we treat if needed. -Discussed and reviewed postpartum planning including contraception, pediatricians, and infant feedings and circumcision desires/costs. *Patient desires to breastfeed and is considering Depo for contraception.  2. [redacted] weeks gestation of pregnancy -Doing well. -Reassured that pelvic pressure is normal especially in 3rd trimester. -Discussed anxiety regarding vaginal delivery. Patient reports feeling more at ease. -Labor Precautions Given   Meds: No orders of the defined types were placed in this encounter.  Labs/procedures today:  Lab Orders  No laboratory test(s) ordered today     Reviewed: Preterm labor symptoms and general obstetric precautions including but not limited to vaginal bleeding, contractions, leaking of fluid and fetal movement were reviewed in detail with the patient.  All questions were answered.  Follow-up: Return in about 1 week (around 06/06/2021) for LROB.  No orders of the defined types were placed in this encounter.  Cherre Robins MSN, CNM 05/30/2021

## 2021-06-09 ENCOUNTER — Other Ambulatory Visit: Payer: Self-pay

## 2021-06-09 ENCOUNTER — Ambulatory Visit (INDEPENDENT_AMBULATORY_CARE_PROVIDER_SITE_OTHER): Payer: Medicaid Other | Admitting: Advanced Practice Midwife

## 2021-06-09 ENCOUNTER — Other Ambulatory Visit (HOSPITAL_COMMUNITY)
Admission: RE | Admit: 2021-06-09 | Discharge: 2021-06-09 | Disposition: A | Discharge status: Home / Self Care | Payer: Medicaid Other | Source: Ambulatory Visit | Attending: Advanced Practice Midwife | Admitting: Advanced Practice Midwife

## 2021-06-09 VITALS — BP 129/83 | HR 108 | Wt 182.8 lb

## 2021-06-09 DIAGNOSIS — Z3493 Encounter for supervision of normal pregnancy, unspecified, third trimester: Secondary | ICD-10-CM | POA: Insufficient documentation

## 2021-06-09 DIAGNOSIS — Z3A37 37 weeks gestation of pregnancy: Secondary | ICD-10-CM

## 2021-06-09 DIAGNOSIS — Z98891 History of uterine scar from previous surgery: Secondary | ICD-10-CM

## 2021-06-09 DIAGNOSIS — R519 Headache, unspecified: Secondary | ICD-10-CM

## 2021-06-09 DIAGNOSIS — O26893 Other specified pregnancy related conditions, third trimester: Secondary | ICD-10-CM

## 2021-06-09 DIAGNOSIS — R030 Elevated blood-pressure reading, without diagnosis of hypertension: Secondary | ICD-10-CM

## 2021-06-09 LAB — OB RESULTS CONSOLE GC/CHLAMYDIA: Gonorrhea: NEGATIVE

## 2021-06-09 NOTE — Progress Notes (Signed)
PRENATAL VISIT NOTE  Subjective:  Sharon Jimenez is a 31 y.o. G2P1001 at 58w2dbeing seen today for ongoing prenatal care.  She is currently monitored for the following issues for this low-risk pregnancy and has Supervision of low-risk pregnancy; Hidradenitis suppurativa; History of C-section; History of trichomoniasis; and [redacted] weeks gestation of pregnancy on their problem list.  Patient reports  moderate headache yesterday, mild h/a today that she has not treated .  Contractions: Not present. Vag. Bleeding: None.  Movement: Present. Denies leaking of fluid.   The following portions of the patient's history were reviewed and updated as appropriate: allergies, current medications, past family history, past medical history, past social history, past surgical history and problem list.   Objective:   Vitals:   06/09/21 0937 06/09/21 0944 06/09/21 1005  BP: (!) 145/83 134/84 129/83  Pulse: (!) 114 (!) 113 (!) 108  Weight: 182 lb 12.8 oz (82.9 kg)       Fetal Status: Fetal Heart Rate (bpm): 133 Fundal Height: 36 cm Movement: Present  Presentation: Vertex  General:  Alert, oriented and cooperative. Patient is in no acute distress.  Skin: Skin is warm and dry. No rash noted.   Cardiovascular: Normal heart rate noted  Respiratory: Normal respiratory effort, no problems with respiration noted  Abdomen: Soft, gravid, appropriate for gestational age.  Pain/Pressure: Present     Pelvic: Cervical exam deferred        Extremities: Normal range of motion.  Edema: Trace  Mental Status: Normal mood and affect. Normal behavior. Normal judgment and thought content.   Assessment and Plan:  Pregnancy: G2P1001 at 345w2d. Encounter for supervision of low-risk pregnancy in third trimester --Anticipatory guidance about next visits/weeks of pregnancy given.  - Strep Gp B NAA - Cervicovaginal ancillary only( [place])  2. [redacted] weeks gestation of pregnancy   3. Elevated blood pressure reading  without diagnosis of hypertension --Pt with single elevated BP today, recheck normal. --No s/sx of PEC, mild h/a that has not required treatment (see below) --Pt has home cuff and uses [employer] so will take daily and enter into [employer]. --BP check in office or virtual in 2 days --Reviewed s/sx of PEC, reasons to go to [facility]  - CBC - Comp Met (CMET) - Protein / creatinine ratio, urine  4. Headache in pregnancy, antepartum, third trimester --Pt reports she has not had caffeine last 2 days and that is what she thinks is causing h/a.   --Pt needs to treat h/a, with caffeine, increased PO fluids, and Tylenol if needed today. Pt states understanding. --If h/a does not resolve, she is to go to MAU today  --If h/a resolves easily, continue daily BP in [employer], BP check in office in 2 days  5. History of C-section --Desires VBAC, consent signed 04/15/21  Term labor symptoms and general obstetric precautions including but not limited to vaginal bleeding, contractions, leaking of fluid and fetal movement were reviewed in detail with the patient. Please refer to After Visit Summary for other counseling recommendations.   No follow-ups on file.  Future Appointments  Date Time Provider [place]1/18/2023  3:00 PM [place]  06/20/2021  8:55 AM ArWoodroe ModeMD [facility]     LiFatima BlankCNM

## 2021-06-10 LAB — PROTEIN / CREATININE RATIO, URINE
Creatinine, Urine: 48.2 mg/dL
Protein, Ur: 11.9 mg/dL
Protein/Creat Ratio: 247 mg/g creat — ABNORMAL HIGH (ref 0–200)

## 2021-06-10 LAB — CERVICOVAGINAL ANCILLARY ONLY
Chlamydia: NEGATIVE
Comment: NEGATIVE
Comment: NORMAL
Neisseria Gonorrhea: NEGATIVE

## 2021-06-11 ENCOUNTER — Telehealth (INDEPENDENT_AMBULATORY_CARE_PROVIDER_SITE_OTHER): Payer: Medicaid Other | Admitting: *Deleted

## 2021-06-11 DIAGNOSIS — R030 Elevated blood-pressure reading, without diagnosis of hypertension: Secondary | ICD-10-CM | POA: Diagnosis not present

## 2021-06-11 DIAGNOSIS — Z0131 Encounter for examination of blood pressure with abnormal findings: Secondary | ICD-10-CM

## 2021-06-11 LAB — STREP GP B NAA: Strep Gp B NAA: NEGATIVE

## 2021-06-11 NOTE — Progress Notes (Signed)
Subjective:  Sharon Jimenez is a 31 y.o. female  following up for virtual BP check appointment. I connected with Sharon Jimenez via MyChart virtual visit and explained the limitations of the visit. She reports her BP was 13/80 at 1503 today. BP's from 06/09/21 visit reviewed.   Hypertension ROS: no TIA's, no chest pain on exertion, no dyspnea on exertion, and no swelling of ankles. Denies HA, visual changes, RUQ abdominal pain or any emergent concerns.   Objective:  LMP 09/15/2020 (Within Days)   Appearance alert, well appearing, and in no distress and oriented to person, place, and time. General exam BP noted to be well controlled today in office.    Assessment:   Blood Pressure stable.   Plan:  Current treatment plan is effective, no change in therapy. I consulted with Dr. Para March. Patient should follow up as scheduled for next OB visit. Preeclampsia precautions given.

## 2021-06-20 ENCOUNTER — Other Ambulatory Visit: Payer: Self-pay

## 2021-06-20 ENCOUNTER — Ambulatory Visit (INDEPENDENT_AMBULATORY_CARE_PROVIDER_SITE_OTHER): Payer: Medicaid Other | Admitting: Obstetrics & Gynecology

## 2021-06-20 VITALS — BP 126/87 | HR 103 | Wt 183.0 lb

## 2021-06-20 DIAGNOSIS — Z98891 History of uterine scar from previous surgery: Secondary | ICD-10-CM

## 2021-06-20 DIAGNOSIS — Z3493 Encounter for supervision of normal pregnancy, unspecified, third trimester: Secondary | ICD-10-CM

## 2021-06-20 NOTE — Progress Notes (Signed)
Pt would like cervix check today.  

## 2021-06-20 NOTE — Progress Notes (Signed)
° °  PRENATAL VISIT NOTE  Subjective:  Sharon Jimenez is a 31 y.o. G2P1001 at [redacted]w[redacted]d being seen today for ongoing prenatal care.  She is currently monitored for the following issues for this low-risk pregnancy and has Supervision of low-risk pregnancy; Hidradenitis suppurativa; History of C-section; History of trichomoniasis; and [redacted] weeks gestation of pregnancy on their problem list.  Patient reports occasional contractions.  Contractions: Irritability. Vag. Bleeding: None.  Movement: Present. Denies leaking of fluid.   The following portions of the patient's history were reviewed and updated as appropriate: allergies, current medications, past family history, past medical history, past social history, past surgical history and problem list.   Objective:   Vitals:   06/20/21 0901  BP: 126/87  Pulse: (!) 103  Weight: 183 lb (83 kg)    Fetal Status: Fetal Heart Rate (bpm): 140   Movement: Present  Presentation: Vertex  General:  Alert, oriented and cooperative. Patient is in no acute distress.  Skin: Skin is warm and dry. No rash noted.   Cardiovascular: Normal heart rate noted  Respiratory: Normal respiratory effort, no problems with respiration noted  Abdomen: Soft, gravid, appropriate for gestational age.  Pain/Pressure: Present     Pelvic: Cervical exam performed in the presence of a chaperone Dilation: 1 Effacement (%): 30 Station: -3  Extremities: Normal range of motion.  Edema: Trace  Mental Status: Normal mood and affect. Normal behavior. Normal judgment and thought content.   Assessment and Plan:  Pregnancy: G2P1001 at [redacted]w[redacted]d 1. History of C-section Plans TOLAC  2. Encounter for supervision of low-risk pregnancy in third trimester Doing well  Term labor symptoms and general obstetric precautions including but not limited to vaginal bleeding, contractions, leaking of fluid and fetal movement were reviewed in detail with the patient. Please refer to After Visit Summary for  other counseling recommendations.   Return in about 1 week (around 06/27/2021).  No future appointments.  Scheryl Darter, MD

## 2021-06-27 ENCOUNTER — Encounter: Payer: Self-pay | Admitting: Obstetrics and Gynecology

## 2021-06-27 ENCOUNTER — Other Ambulatory Visit: Payer: Self-pay

## 2021-06-27 ENCOUNTER — Encounter (HOSPITAL_COMMUNITY): Payer: Self-pay

## 2021-06-27 ENCOUNTER — Encounter (HOSPITAL_COMMUNITY): Payer: Self-pay | Admitting: *Deleted

## 2021-06-27 ENCOUNTER — Ambulatory Visit (INDEPENDENT_AMBULATORY_CARE_PROVIDER_SITE_OTHER): Payer: Medicaid Other | Admitting: Obstetrics and Gynecology

## 2021-06-27 ENCOUNTER — Telehealth (HOSPITAL_COMMUNITY): Payer: Self-pay | Admitting: *Deleted

## 2021-06-27 VITALS — BP 127/84 | HR 109 | Wt 184.4 lb

## 2021-06-27 DIAGNOSIS — Z3493 Encounter for supervision of normal pregnancy, unspecified, third trimester: Secondary | ICD-10-CM

## 2021-06-27 DIAGNOSIS — Z98891 History of uterine scar from previous surgery: Secondary | ICD-10-CM

## 2021-06-27 NOTE — Patient Instructions (Signed)
Vaginal Delivery ?Vaginal delivery means that you give birth by pushing your baby out of your birth canal (vagina). Your health care team will help you before, during, and after vaginal delivery. ?Birth experiences are unique for every woman and every pregnancy, and birth experiences vary depending on where you choose to give birth. ?What are the risks and benefits? ?Generally, this is safe. However, problems may occur, including: ?Bleeding. ?Infection. ?Damage to other structures such as vaginal tearing. ?Allergic reactions to medicines. ?Despite the risks, benefits of vaginal delivery include less risk of bleeding and infection and a shorter recovery time compared to a Cesarean delivery. Cesarean delivery, or C-section, is the surgical delivery of a baby. ?What happens when I arrive at the birth center or hospital? ?Once you are in labor and have been admitted into the hospital or birth center, your health care team may: ?Review your pregnancy history and any concerns that you have. ?Talk with you about your birth plan and discuss pain control options. ?Check your blood pressure, breathing, and heartbeat. ?Assess your baby's heartbeat. ?Monitor your uterus for contractions. ?Check whether your bag of water (amniotic sac) has broken (ruptured). ?Insert an IV into one of your veins. This may be used to give you fluids and medicines. ?Monitoring ?Your health care team may assess your contractions (uterine monitoring) and your baby's heart rate (fetal monitoring). You may need to be monitored: ?Often, but not continuously (intermittently). ?All the time or for long periods at a time (continuously). Continuous monitoring may be needed if: ?You are taking certain medicines, such as medicine to relieve pain or make your contractions stronger. ?You have pregnancy or labor complications. ?Monitoring may be done by: ?Placing a special stethoscope or a handheld monitoring device on your abdomen to check your baby's heartbeat  and to check for contractions. ?Placing monitors on your abdomen (external monitors) to record your baby's heartbeat and the frequency and length of contractions. ?Placing monitors inside your uterus through your vagina (internal monitors) to record your baby's heartbeat and the frequency, length, and strength of your contractions. Depending on the type of monitor, it may remain in your uterus or on your baby's head until birth. ?Telemetry. This is a type of continuous monitoring that can be done with external or internal monitors. Instead of having to stay in bed, you are able to move around. ?Physical exam ?Your health care team may perform frequent physical exams. This may include: ?Checking how and where your baby is positioned in your uterus. ?Checking your cervix to determine: ?Whether it is thinning out (effacing). ?Whether it is opening up (dilating). ?What happens during labor and delivery? ?Normal labor and delivery is divided into the following three stages: ?Stage 1 ?This is the longest stage of labor. ?Throughout this stage, you will feel contractions. Contractions generally feel mild, infrequent, and irregular at first. They get stronger, more frequent, and more regular as you move through this stage. You may have contractions about every 2-3 minutes. ?This stage ends when your cervix is completely dilated to 4 inches (10 cm) and completely effaced. ?Stage 2 ?This stage starts once your cervix is completely effaced and dilated and lasts until the delivery of your baby. ?This is the stage where you will feel an urge to push your baby out of your vagina. ?You may feel stretching and burning pain, especially when the widest part of your baby's head passes through the vaginal opening (crowning). ?Once your baby is delivered, the umbilical cord will be clamped and   cut. Timing of cutting the cord will depend on your wishes, your baby's health, and your health care provider's practices. ?Your baby will be  placed on your bare chest (skin-to-skin contact) in an upright position and covered with a warm blanket. If you are choosing to breastfeed, watch your baby for feeding cues, like rooting or sucking, and help the baby to your breast for his or her first feeding. ?Stage 3 ?This stage starts immediately after the birth of your baby and ends after you deliver the placenta. ?This stage may take anywhere from 5 to 30 minutes. ?After your baby has been delivered, you will feel contractions as your body expels the placenta. These contractions also help your uterus get smaller and reduce bleeding. ?What can I expect after labor and delivery? ?After labor is over, you and your baby will be assessed closely until you are ready to go home. Your health care team will teach you how to care for yourself and your baby. ?You and your baby may be encouraged to stay in the same room (rooming in) during your hospital stay. This will help promote early bonding and successful breastfeeding. ?Your uterus will be checked and massaged regularly (fundal massage). ?You may continue to receive fluids and medicines through an IV. ?You will have some soreness and pain in your abdomen, vagina, and the area of skin between your vaginal opening and your anus (perineum). ?If an incision was made near your vagina (episiotomy) or if you had some vaginal tearing during delivery, cold compresses may be placed on your episiotomy or your tear. This helps to reduce pain and swelling. ?It is normal to have vaginal bleeding after delivery. Wear a sanitary pad for vaginal bleeding and discharge. ?Summary ?Vaginal delivery means that you will give birth by pushing your baby out of your birth canal (vagina). ?Your health care team will monitor you and your baby throughout the stages of labor. ?After you deliver your baby, your health care team will continue to assess you and your baby to ensure you are both recovering as expected after delivery. ?This  information is not intended to replace advice given to you by your health care provider. Make sure you discuss any questions you have with your health care provider. ?Document Revised: 04/08/2020 Document Reviewed: 04/08/2020 ?Elsevier Patient Education ? 2022 [employer] ? ?

## 2021-06-27 NOTE — Progress Notes (Signed)
Subjective:  Sharon Jimenez is a 31 y.o. G2P1001 at [redacted]w[redacted]d being seen today for ongoing prenatal care.  She is currently monitored for the following issues for this low-risk pregnancy and has Supervision of low-risk pregnancy; History of C-section; and History of trichomoniasis on their problem list.  Patient reports general discomforts of pregancy.  Contractions: Irritability. Vag. Bleeding: None.  Movement: Present. Denies leaking of fluid.   The following portions of the patient's history were reviewed and updated as appropriate: allergies, current medications, past family history, past medical history, past social history, past surgical history and problem list. Problem list updated.  Objective:   Vitals:   06/27/21 0842  BP: 127/84  Pulse: (!) 109  Weight: 184 lb 6.4 oz (83.6 kg)    Fetal Status: Fetal Heart Rate (bpm): 156   Movement: Present     General:  Alert, oriented and cooperative. Patient is in no acute distress.  Skin: Skin is warm and dry. No rash noted.   Cardiovascular: Normal heart rate noted  Respiratory: Normal respiratory effort, no problems with respiration noted  Abdomen: Soft, gravid, appropriate for gestational age. Pain/Pressure: Present     Pelvic:  Cervical exam performed        Extremities: Normal range of motion.     Mental Status: Normal mood and affect. Normal behavior. Normal judgment and thought content.   Urinalysis:      Assessment and Plan:  Pregnancy: G2P1001 at [redacted]w[redacted]d  1. Encounter for supervision of low-risk pregnancy in third trimester Labor precautions  IOL schedule BPP with next visit if needed  2. History of C-section TOLAC  Term labor symptoms and general obstetric precautions including but not limited to vaginal bleeding, contractions, leaking of fluid and fetal movement were reviewed in detail with the patient. Please refer to After Visit Summary for other counseling recommendations.  Return in about 1 week (around 07/04/2021)  for OB visit, face to face, any provider, BPP with NST.   Hermina Staggers, MD

## 2021-06-27 NOTE — Telephone Encounter (Signed)
Preadmission screen  

## 2021-06-29 ENCOUNTER — Other Ambulatory Visit: Payer: Self-pay | Admitting: Advanced Practice Midwife

## 2021-06-30 ENCOUNTER — Encounter (HOSPITAL_COMMUNITY): Payer: Self-pay | Admitting: Obstetrics and Gynecology

## 2021-07-01 ENCOUNTER — Other Ambulatory Visit: Payer: Self-pay | Admitting: Obstetrics and Gynecology

## 2021-07-01 LAB — SARS CORONAVIRUS 2 (TAT 6-24 HRS): SARS Coronavirus 2: NEGATIVE

## 2021-07-02 ENCOUNTER — Encounter (HOSPITAL_COMMUNITY): Payer: Self-pay | Admitting: Obstetrics and Gynecology

## 2021-07-02 ENCOUNTER — Other Ambulatory Visit: Payer: Self-pay

## 2021-07-02 ENCOUNTER — Inpatient Hospital Stay (HOSPITAL_COMMUNITY): Payer: 59 | Admitting: Anesthesiology

## 2021-07-02 ENCOUNTER — Inpatient Hospital Stay (HOSPITAL_COMMUNITY)
Admission: AD | Admit: 2021-07-02 | Discharge: 2021-07-04 | DRG: 805 | Disposition: A | Discharge status: Home / Self Care | Payer: 59 | Attending: Obstetrics and Gynecology | Admitting: Obstetrics and Gynecology

## 2021-07-02 DIAGNOSIS — O26893 Other specified pregnancy related conditions, third trimester: Secondary | ICD-10-CM | POA: Diagnosis present

## 2021-07-02 DIAGNOSIS — O4593 Premature separation of placenta, unspecified, third trimester: Secondary | ICD-10-CM | POA: Diagnosis present

## 2021-07-02 DIAGNOSIS — O480 Post-term pregnancy: Principal | ICD-10-CM | POA: Diagnosis present

## 2021-07-02 DIAGNOSIS — O34211 Maternal care for low transverse scar from previous cesarean delivery: Secondary | ICD-10-CM | POA: Diagnosis not present

## 2021-07-02 DIAGNOSIS — O34219 Maternal care for unspecified type scar from previous cesarean delivery: Secondary | ICD-10-CM | POA: Diagnosis present

## 2021-07-02 DIAGNOSIS — Z98891 History of uterine scar from previous surgery: Secondary | ICD-10-CM

## 2021-07-02 DIAGNOSIS — Z3A40 40 weeks gestation of pregnancy: Secondary | ICD-10-CM

## 2021-07-02 DIAGNOSIS — Z3493 Encounter for supervision of normal pregnancy, unspecified, third trimester: Secondary | ICD-10-CM

## 2021-07-02 DIAGNOSIS — Z8619 Personal history of other infectious and parasitic diseases: Secondary | ICD-10-CM

## 2021-07-02 DIAGNOSIS — Z3490 Encounter for supervision of normal pregnancy, unspecified, unspecified trimester: Secondary | ICD-10-CM

## 2021-07-02 LAB — CBC
HCT: 36.4 % (ref 36.0–46.0)
Hemoglobin: 12.4 g/dL (ref 12.0–15.0)
MCH: 33.8 pg (ref 26.0–34.0)
MCHC: 34.1 g/dL (ref 30.0–36.0)
MCV: 99.2 fL (ref 80.0–100.0)
Platelets: 236 10*3/uL (ref 150–400)
RBC: 3.67 MIL/uL — ABNORMAL LOW (ref 3.87–5.11)
RDW: 15.1 % (ref 11.5–15.5)
WBC: 13.5 10*3/uL — ABNORMAL HIGH (ref 4.0–10.5)
nRBC: 0.1 % (ref 0.0–0.2)

## 2021-07-02 LAB — RPR: RPR Ser Ql: NONREACTIVE

## 2021-07-02 LAB — TYPE AND SCREEN
ABO/RH(D): O POS
Antibody Screen: NEGATIVE

## 2021-07-02 MED ORDER — FENTANYL CITRATE (PF) 100 MCG/2ML IJ SOLN: 50.0000 ug | INTRAMUSCULAR | Status: DC | PRN

## 2021-07-02 MED ORDER — FENTANYL-BUPIVACAINE-NACL 0.5-0.125-0.9 MG/250ML-% EP SOLN
EPIDURAL | Status: AC
  Filled 2021-07-02: qty 250

## 2021-07-02 MED ORDER — PHENYLEPHRINE 40 MCG/ML (10ML) SYRINGE FOR IV PUSH (FOR BLOOD PRESSURE SUPPORT): 80.0000 ug | PREFILLED_SYRINGE | INTRAVENOUS | Status: DC | PRN

## 2021-07-02 MED ORDER — SODIUM CHLORIDE 0.9% FLUSH
3.0000 mL | Freq: Two times a day (BID) | INTRAVENOUS | Status: DC
  Administered 2021-07-02: 3 mL via INTRAVENOUS

## 2021-07-02 MED ORDER — ACETAMINOPHEN 325 MG PO TABS: 650.0000 mg | ORAL_TABLET | ORAL | Status: DC | PRN

## 2021-07-02 MED ORDER — DIBUCAINE (PERIANAL) 1 % EX OINT: 1.0000 "application " | TOPICAL_OINTMENT | CUTANEOUS | Status: DC | PRN

## 2021-07-02 MED ORDER — TRANEXAMIC ACID-NACL 1000-0.7 MG/100ML-% IV SOLN
INTRAVENOUS | Status: AC
  Administered 2021-07-02: 1000 mg via INTRAVENOUS
  Filled 2021-07-02: qty 100

## 2021-07-02 MED ORDER — EPHEDRINE 5 MG/ML INJ: 10.0000 mg | INTRAVENOUS | Status: DC | PRN

## 2021-07-02 MED ORDER — OXYTOCIN-SODIUM CHLORIDE 30-0.9 UT/500ML-% IV SOLN
2.5000 [IU]/h | INTRAVENOUS | Status: DC
  Filled 2021-07-02: qty 500

## 2021-07-02 MED ORDER — ONDANSETRON HCL 4 MG/2ML IJ SOLN: 4.0000 mg | INTRAMUSCULAR | Status: DC | PRN

## 2021-07-02 MED ORDER — ONDANSETRON HCL 4 MG PO TABS: 4.0000 mg | ORAL_TABLET | ORAL | Status: DC | PRN

## 2021-07-02 MED ORDER — WITCH HAZEL-GLYCERIN EX PADS: 1.0000 "application " | MEDICATED_PAD | CUTANEOUS | Status: DC | PRN

## 2021-07-02 MED ORDER — DIPHENHYDRAMINE HCL 50 MG/ML IJ SOLN: 12.5000 mg | INTRAMUSCULAR | Status: DC | PRN

## 2021-07-02 MED ORDER — COCONUT OIL OIL
1.0000 "application " | TOPICAL_OIL | Status: DC | PRN
  Administered 2021-07-04: 1 via TOPICAL

## 2021-07-02 MED ORDER — SENNOSIDES-DOCUSATE SODIUM 8.6-50 MG PO TABS
2.0000 | ORAL_TABLET | ORAL | Status: DC
  Administered 2021-07-02 – 2021-07-03 (×2): 2 via ORAL
  Filled 2021-07-02 (×2): qty 2

## 2021-07-02 MED ORDER — TRANEXAMIC ACID-NACL 1000-0.7 MG/100ML-% IV SOLN: 1000.0000 mg | INTRAVENOUS | Status: AC

## 2021-07-02 MED ORDER — LACTATED RINGERS IV SOLN: INTRAVENOUS | Status: DC

## 2021-07-02 MED ORDER — LIDOCAINE HCL (PF) 1 % IJ SOLN: 30.0000 mL | INTRAMUSCULAR | Status: DC | PRN

## 2021-07-02 MED ORDER — LACTATED RINGERS IV SOLN
500.0000 mL | Freq: Once | INTRAVENOUS | Status: AC
  Administered 2021-07-02: 500 mL via INTRAVENOUS

## 2021-07-02 MED ORDER — TETANUS-DIPHTH-ACELL PERTUSSIS 5-2.5-18.5 LF-MCG/0.5 IM SUSY: 0.5000 mL | PREFILLED_SYRINGE | Freq: Once | INTRAMUSCULAR | Status: DC

## 2021-07-02 MED ORDER — FENTANYL-BUPIVACAINE-NACL 0.5-0.125-0.9 MG/250ML-% EP SOLN
EPIDURAL | Status: DC | PRN
  Administered 2021-07-02: 12 mL/h via EPIDURAL

## 2021-07-02 MED ORDER — PRENATAL MULTIVITAMIN CH
1.0000 | ORAL_TABLET | Freq: Every day | ORAL | Status: DC
  Administered 2021-07-03 – 2021-07-04 (×2): 1 via ORAL
  Filled 2021-07-02 (×2): qty 1

## 2021-07-02 MED ORDER — IBUPROFEN 600 MG PO TABS
600.0000 mg | ORAL_TABLET | Freq: Four times a day (QID) | ORAL | Status: DC
  Administered 2021-07-02 – 2021-07-04 (×8): 600 mg via ORAL
  Filled 2021-07-02 (×8): qty 1

## 2021-07-02 MED ORDER — ZOLPIDEM TARTRATE 5 MG PO TABS: 5.0000 mg | ORAL_TABLET | Freq: Every evening | ORAL | Status: DC | PRN

## 2021-07-02 MED ORDER — BENZOCAINE-MENTHOL 20-0.5 % EX AERO
1.0000 "application " | INHALATION_SPRAY | CUTANEOUS | Status: DC | PRN
  Administered 2021-07-02: 1 via TOPICAL
  Filled 2021-07-02: qty 56

## 2021-07-02 MED ORDER — EPHEDRINE 5 MG/ML INJ
10.0000 mg | INTRAVENOUS | Status: DC | PRN
  Filled 2021-07-02: qty 2

## 2021-07-02 MED ORDER — ONDANSETRON HCL 4 MG/2ML IJ SOLN: 4.0000 mg | Freq: Four times a day (QID) | INTRAMUSCULAR | Status: DC | PRN

## 2021-07-02 MED ORDER — DIPHENHYDRAMINE HCL 25 MG PO CAPS: 25.0000 mg | ORAL_CAPSULE | Freq: Four times a day (QID) | ORAL | Status: DC | PRN

## 2021-07-02 MED ORDER — OXYCODONE-ACETAMINOPHEN 5-325 MG PO TABS: 2.0000 | ORAL_TABLET | ORAL | Status: DC | PRN

## 2021-07-02 MED ORDER — MEASLES, MUMPS & RUBELLA VAC IJ SOLR: 0.5000 mL | Freq: Once | INTRAMUSCULAR | Status: DC

## 2021-07-02 MED ORDER — SIMETHICONE 80 MG PO CHEW: 80.0000 mg | CHEWABLE_TABLET | ORAL | Status: DC | PRN

## 2021-07-02 MED ORDER — SODIUM CHLORIDE 0.9 % IV SOLN: 250.0000 mL | INTRAVENOUS | Status: DC | PRN

## 2021-07-02 MED ORDER — OXYTOCIN BOLUS FROM INFUSION
333.0000 mL | Freq: Once | INTRAVENOUS | Status: AC
  Administered 2021-07-02: 333 mL via INTRAVENOUS

## 2021-07-02 MED ORDER — LACTATED RINGERS IV SOLN: 500.0000 mL | INTRAVENOUS | Status: DC | PRN

## 2021-07-02 MED ORDER — SOD CITRATE-CITRIC ACID 500-334 MG/5ML PO SOLN: 30.0000 mL | ORAL | Status: DC | PRN

## 2021-07-02 MED ORDER — PHENYLEPHRINE 40 MCG/ML (10ML) SYRINGE FOR IV PUSH (FOR BLOOD PRESSURE SUPPORT)
80.0000 ug | PREFILLED_SYRINGE | INTRAVENOUS | Status: DC | PRN
  Filled 2021-07-02: qty 10

## 2021-07-02 MED ORDER — OXYCODONE-ACETAMINOPHEN 5-325 MG PO TABS: 1.0000 | ORAL_TABLET | ORAL | Status: DC | PRN

## 2021-07-02 MED ORDER — LIDOCAINE HCL (PF) 1 % IJ SOLN
INTRAMUSCULAR | Status: DC | PRN
  Administered 2021-07-02: 5 mL via EPIDURAL

## 2021-07-02 MED ORDER — FENTANYL-BUPIVACAINE-NACL 0.5-0.125-0.9 MG/250ML-% EP SOLN: 12.0000 mL/h | EPIDURAL | Status: DC | PRN

## 2021-07-02 MED ORDER — SODIUM CHLORIDE 0.9% FLUSH: 3.0000 mL | INTRAVENOUS | Status: DC | PRN

## 2021-07-02 NOTE — Progress Notes (Signed)
Labor Progress Note NELLA TRAGESSER is a 31 y.o. G2P1001 at [redacted]w[redacted]d presented for SOL/TOLAC  S: Doing well, feeling contractions getting closer together. Epidural in place.   O:  BP (!) 141/95    Pulse (!) 106    Temp 98.7 F (37.1 C) (Oral)    Resp 16    Ht 5\' 5"  (1.651 m)    Wt 82.1 kg    LMP 09/15/2020 (Within Days)    SpO2 98%    BMI 30.12 kg/m  EFM: 150/mod/none/occasional small variable   CVE: Dilation: 5 Effacement (%): 100 Cervical Position: Posterior Station: 0 Presentation: Vertex Exam by:: Ola Spurr, RN   A&P: 31 y.o. G2P1001 [redacted]w[redacted]d  #Labor: Progressing well with expectant management after recently checked by RN with good contraction pattern. Will cont as is.  #Pain: Epidural   #FWB: Cat I, Cat II at times (due to occasional variable) but reassuring  #GBS negative  #Elevated bp: last check elevated, but otherwise has been WNL. Will monitor for now, if repeating plan to get baseline labs.   Patriciaann Clan, DO 11:53 AM
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
## 2021-07-02 NOTE — Discharge Summary (Signed)
Postpartum Discharge Summary    Patient Name: Sharon Jimenez DOB: 1990/12/22 MRN: [id number]  Date of admission: 07/02/2021 Delivery date:07/02/2021  Delivering provider: Patriciaann Clan  Date of discharge: 07/04/2021  Admitting diagnosis: Post-dates pregnancy [O48.0] Intrauterine pregnancy: [redacted]w[redacted]d    Secondary diagnosis:  Principal Problem:   VBAC (vaginal birth after Cesarean) Active Problems:   Supervision of low-risk pregnancy   History of C-section   Post-dates pregnancy  Additional problems:     Discharge diagnosis: Term Pregnancy Delivered                                              Post partum procedures: None Augmentation: AROM Complications: None  Hospital course: Onset of Labor With Vaginal Delivery      31y.o. yo G2P1001 at 49w4das admitted in Latent Labor on 07/02/2021. Patient had an uncomplicated labor course as follows:  Membrane Rupture Time/Date: 3:46 PM ,07/02/2021   Delivery Method:VBAC, Spontaneous  Episiotomy: None  Lacerations:  Periurethral;Vaginal  Patient had an uncomplicated postpartum course.  She is ambulating, tolerating a regular diet, passing flatus, and urinating well.  Patient is discharged home in stable condition on 07/04/21.  Newborn Data: Birth date:07/02/2021  Birth time:4:37 PM  Gender:Female  Living status:Living  Apgars:9 ,9  Weight:2860 g   Magnesium Sulfate received: No BMZ received: No Rhophylac: No MMR: No T-DaP: Given prenatally Flu: No Transfusion: No  Physical exam  Vitals:   07/03/21 0730 07/03/21 1501 07/03/21 1900 07/04/21 0608  BP: 116/70 121/79 123/75 122/80  Pulse: 79 88 90 81  Resp: 18 18 17 16   Temp: 98.2 F (36.8 C) 98.1 F (36.7 C) 97.9 F (36.6 C) 98.2 F (36.8 C)  TempSrc: Oral  Oral Oral  SpO2:  99%    Weight:      Height:       General: alert, cooperative, and no distress Lochia: appropriate Uterine Fundus: firm Incision: N/A DVT Evaluation: No evidence of DVT seen on physical  exam.  Labs: Lab Results  Component Value Date   WBC 13.5 (H) 07/02/2021   HGB 12.4 07/02/2021   HCT 36.4 07/02/2021   MCV 99.2 07/02/2021   PLT 236 07/02/2021   No flowsheet data found. Edinburgh Score: Edinburgh Postnatal Depression Scale Screening Tool 07/03/2021  I have been able to laugh and see the funny side of things. 1  I have looked forward with enjoyment to things. 1  I have blamed myself unnecessarily when things went wrong. 1  I have been anxious or worried for no good reason. 2  I have felt scared or panicky for no good reason. 2  Things have been getting on top of me. 1  I have been so unhappy that I have had difficulty sleeping. 1  I have felt sad or miserable. 1  I have been so unhappy that I have been crying. 1  The thought of harming myself has occurred to me. 0  Edinburgh Postnatal Depression Scale Total 11     After visit meds:  Allergies as of 07/04/2021   No Known Allergies      Medication List     TAKE these medications    acetaminophen 500 MG tablet Commonly known as: TYLENOL Take 2 tablets (1,000 mg total) by mouth every 8 (eight) hours as needed (pain).   doxylamine (Sleep) 25  MG tablet Commonly known as: UNISOM Take 25 mg by mouth at bedtime as needed for sleep.   ibuprofen 600 MG tablet Commonly known as: ADVIL Take 1 tablet (600 mg total) by mouth every 6 (six) hours as needed (pain).   PrePLUS 27-1 MG Tabs Take 1 tablet by mouth daily.         Discharge home in stable condition Infant Feeding: Breast Infant Disposition: home with mother Discharge instruction: per After Visit Summary and Postpartum booklet. Activity: Advance as tolerated. Pelvic rest for 6 weeks.  Diet: routine diet Future Appointments: Future Appointments  Date Time Provider [place]  07/10/2021 11:00 AM [place] None  08/12/2021  1:10 PM Griffin Basil, MD [facility] None    Follow up Visit: Message sent to [facility] by Dr Higinio Plan:   Please schedule this patient for a In person postpartum visit in 6 weeks with the following provider: Any provider. Additional Postpartum F/U: BP check 1 week  (few BP elevated in labor) Low risk pregnancy complicated by:  History of CS  Delivery mode:  VBAC, Spontaneous  Anticipated Birth Control:  Unsure, considering Depo  Sent message on 2/10 for mood check in 2 weeks due to history of postpartum depression.   07/04/2021 Caren Macadam, MD

## 2021-07-02 NOTE — Progress Notes (Signed)
Labor Progress Note Sharon Jimenez is a 31 y.o. G2P1001 at [redacted]w[redacted]d who presented for early labor/TOLAC.   S: Doing well. Comfortable with epidural. No concerns at this time.   O:  BP 108/60    Pulse 82    Temp 98.5 F (36.9 C) (Oral)    Resp 16    Ht 5\' 5"  (1.651 m)    Wt 82.1 kg    LMP 09/15/2020 (Within Days)    SpO2 98%    BMI 30.12 kg/m   EFM: Baseline 120 bpm, moderate variability, + accels, no decels  CVE: Dilation: 3 Effacement (%): 80, 90 Cervical Position: Posterior Station: -1 Presentation: Vertex Exam by:: Dr Gwenlyn Perking  A&P: 31 y.o. G2P1001 [redacted]w[redacted]d   #Labor/TOLAC: Progressing well since last exam. Contracting regularly. Will continue expectant management and reassess in 3-4 hours. Will augment with Pitocin as needed.  #Pain: Epidural  #FWB: Cat 1 currently, intermittent category 2 due to variable decels. Reassuring variability and continues to have accels. Will continue to monitor. #GBS negative  [name], MD 8:39 AM

## 2021-07-02 NOTE — Anesthesia Procedure Notes (Signed)
Epidural Patient location during procedure: OB Start time: 07/02/2021 6:09 AM End time: 07/02/2021 6:21 AM  Staffing Anesthesiologist: Trevor Iha, MD Performed: anesthesiologist   Preanesthetic Checklist Completed: patient identified, IV checked, site marked, risks and benefits discussed, surgical consent, monitors and equipment checked, pre-op evaluation and timeout performed  Epidural Patient position: sitting Prep: DuraPrep and site prepped and draped Patient monitoring: continuous pulse ox and blood pressure Approach: midline Injection technique: LOR air  Needle:  Needle type: Tuohy  Needle gauge: 17 G Needle length: 9 cm and 9 Needle insertion depth: 6 cm Catheter type: closed end flexible Catheter size: 19 Gauge Catheter at skin depth: 12 cm Test dose: negative  Assessment Events: blood not aspirated, injection not painful, no injection resistance, no paresthesia and negative IV test  Additional Notes Patient identified. Risks/Benefits/Options discussed with patient including but not limited to bleeding, infection, nerve damage, paralysis, failed block, incomplete pain control, headache, blood pressure changes, nausea, vomiting, reactions to medication both or allergic, itching and postpartum back pain. Confirmed with bedside nurse the patient's most recent platelet count. Confirmed with patient that they are not currently taking any anticoagulation, have any bleeding history or any family history of bleeding disorders. Patient expressed understanding and wished to proceed. All questions were answered. Sterile technique was used throughout the entire procedure. Please see nursing notes for vital signs. Test dose was given through epidural needle and negative prior to continuing to dose epidural or start infusion. Warning signs of high block given to the patient including shortness of breath, tingling/numbness in hands, complete motor block, or any concerning symptoms with  instructions to call for help. Patient was given instructions on fall risk and not to get out of bed. All questions and concerns addressed with instructions to call with any issues. 1  Attempt (S) . Patient tolerated procedure well.

## 2021-07-02 NOTE — Anesthesia Preprocedure Evaluation (Signed)
Anesthesia Evaluation  Patient identified by MRN, date of birth, ID band Patient awake    Reviewed: Allergy & Precautions, NPO status , Patient's Chart, lab work & pertinent test results  Airway Mallampati: II  TM Distance: >3 FB Neck ROM: Full    Dental no notable dental hx. (+) Teeth Intact, Dental Advisory Given   Pulmonary former smoker,    Pulmonary exam normal breath sounds clear to auscultation       Cardiovascular Exercise Tolerance: Good negative cardio ROS Normal cardiovascular exam Rhythm:Regular Rate:Normal     Neuro/Psych    GI/Hepatic negative GI ROS, Neg liver ROS,   Endo/Other  negative endocrine ROS  Renal/GU negative Renal ROS     Musculoskeletal negative musculoskeletal ROS (+)   Abdominal   Peds  Hematology Lab Results      Component                Value               Date                           HGB                      12.4                07/02/2021                HCT                      36.4                07/02/2021                PLT                      236                 07/02/2021              Anesthesia Other Findings   Reproductive/Obstetrics (+) Pregnancy                             Anesthesia Physical Anesthesia Plan  ASA: 2  Anesthesia Plan: Epidural   Post-op Pain Management:    Induction:   PONV Risk Score and Plan:   Airway Management Planned:   Additional Equipment:   Intra-op Plan:   Post-operative Plan:   Informed Consent: I have reviewed the patients History and Physical, chart, labs and discussed the procedure including the risks, benefits and alternatives for the proposed anesthesia with the patient or authorized representative who has indicated his/her understanding and acceptance.       Plan Discussed with:   Anesthesia Plan Comments: (40.4 wk G2P1 for LEA for Tolac)       Anesthesia Quick Evaluation

## 2021-07-02 NOTE — Progress Notes (Signed)
Labor Progress Note JACKLYNN DEHAAS is a 31 y.o. G2P1001 at [redacted]w[redacted]d presented for SOL/TOLAC.   S: Starting to feel more pressure. Feeling contractions more in her back.   O:  BP 130/79    Pulse 90    Temp 98.4 F (36.9 C) (Oral)    Resp 18    Ht 5\' 5"  (1.651 m)    Wt 82.1 kg    LMP 09/15/2020 (Within Days)    SpO2 98%    BMI 30.12 kg/m  EFM: 150/min to mod/none/early   CVE: Dilation: 6 Effacement (%): 100 Cervical Position: Posterior Station: -1 Presentation: Vertex Exam by:: Dr. [email or web address]   A&P: 31 y.o. G2P1001 [redacted]w[redacted]d  #Labor: Unchanged since last check, fetal head positioning feels OT to maternal left. After discussion, opted for AROM. Initially able to feel a very thin membrane, performed AROM without significant fluid retrieved (having bloody show, difficult to see), however no longer feeling the thin membrane. Will reassess on next check and re-attempt if needed.  #Pain: Epidural  #FWB: Cat II with minimal variability at times, just switched to right lateral side. Can add internals if persisting.  #GBS negative   [redacted]w[redacted]d, DO 4:00 PM

## 2021-07-02 NOTE — MAU Note (Signed)
Ctxs since 2300 which have gotten closer and stronger. Denies VB or LOF. Good FM 4cm last sve

## 2021-07-02 NOTE — Lactation Note (Signed)
This note was copied from a baby's chart. Lactation Consultation Note  Patient Name: Boy Lindia Garms RSWNI'O Date: 07/02/2021 Reason for consult: L&D Initial assessment;Term Age:33 hours   Initial L&D Consult:  Attempted to visit with family > 1 hour after birth.  Staff members attending to mother/baby.  Will follow up on the M/B unit.     Maternal Data    Feeding    LATCH Score                    Lactation Tools Discussed/Used    Interventions    Discharge    Consult Status Consult Status: Follow-up from L&D    Jordany Russett R Ivelise Castillo 07/02/2021, 5:50 PM

## 2021-07-03 ENCOUNTER — Inpatient Hospital Stay (HOSPITAL_COMMUNITY): Payer: Medicaid Other

## 2021-07-03 ENCOUNTER — Inpatient Hospital Stay (HOSPITAL_COMMUNITY)
Admission: AD | Admit: 2021-07-03 | Payer: Medicaid Other | Source: Home / Self Care | Admitting: Obstetrics and Gynecology

## 2021-07-03 DIAGNOSIS — Z8619 Personal history of other infectious and parasitic diseases: Secondary | ICD-10-CM

## 2021-07-03 DIAGNOSIS — O34219 Maternal care for unspecified type scar from previous cesarean delivery: Secondary | ICD-10-CM | POA: Diagnosis not present

## 2021-07-03 DIAGNOSIS — Z3493 Encounter for supervision of normal pregnancy, unspecified, third trimester: Secondary | ICD-10-CM

## 2021-07-03 NOTE — Social Work (Signed)
CSW received consult for Edinburgh 11. CSW met with MOB to offer support and complete assessment.   ° °CSW met with MOB at bedside and introduced CSW role. CSW observed MOB in bed and infant next to be asleep in the bassinet. MOB presented pleasant, calm, and welcomed CSW visit. CSW inquired how MOB has felt since giving birth. MOB reported feeling "okay, and tired." MOB shared the L&D was "very different " in comparison to the c-section she had with her older child. CSW inquired how MOB felt emotionally during the pregnancy. MOB reported that she felt tied, restless, and worried throughout the pregnancy. MOB reported the Edinburgh reflects how she felt throughout the entire pregnancy not just the past 7 days. MOB reported that she felt anxious and worried about the delivery process because she did not want a repeat c-section. MOB reported during her PNC visits she asked several questions and felt her questions were always answered and made her feel at ease. MOB reported she lost enjoyment in activities like taking her four-year-old son to the park because she felt physically felt uncomfortable. MOB reported she felt miserable and sad because she felt uncomfortable. MOB reported prior to the pregnancy she experienced bouts of anxiousness, but it seemed to worsen during the pregnancy. MOB reported never been diagnosed with anxiety or received treatment. MOB reported she is open to therapy resources. CSW discussed PPD/anxiety. MOB shared that she experienced PPD after she gave birth four years ago as evidenced by her not feeling like she was being a good parent. MOB reported discussed this with her OB provider. CSW provided education regarding the baby blues period vs. perinatal mood disorders, discussed treatment and gave resources for mental health follow up if concerns arise.  CSW recommended MOB complete a self-evaluation during the postpartum time period using the New Mom Checklist from Postpartum Progress and  encouraged MOB to contact a medical professional if symptoms are noted at any time.  MOB reported she feels comfortable reaching out to her doctor if she has concerns. MOB identified her mom, FOB and aunt as supports. CSW assessed MOB for safety. MOB denied thoughts of harm to self and others.  ° °MOB reported she has essential items for the infant including a bassinet where the infant will sleep. CSW provided review of Sudden Infant Death Syndrome (SIDS) precautions. MOB has chosen [facility] for the infants follow up care. CSW assessed MOB for additional needs. MOB reported no further needs.  ° °CSW identifies no further need for intervention and no barriers to discharge at this time. ° °Ester Mabe, MSW, LCSW °[facility]  °Clinical Social Worker  °[phone] °07/03/2021  11:12 AM  °

## 2021-07-03 NOTE — Anesthesia Postprocedure Evaluation (Signed)
Anesthesia Post Note  Patient: Sharon Jimenez  Procedure(s) Performed: AN AD HOC LABOR EPIDURAL     Patient location during evaluation: Mother Baby Anesthesia Type: Epidural Level of consciousness: awake, oriented and awake and alert Pain management: pain level controlled Vital Signs Assessment: post-procedure vital signs reviewed and stable Respiratory status: spontaneous breathing, respiratory function stable and nonlabored ventilation Cardiovascular status: stable Postop Assessment: adequate PO intake, able to ambulate, patient able to bend at knees, no headache and no apparent nausea or vomiting Anesthetic complications: no   No notable events documented.  Last Vitals:  Vitals:   07/03/21 0326 07/03/21 0730  BP: 118/70 116/70  Pulse: 90 79  Resp: 17 18  Temp: 36.6 C 36.8 C  SpO2: 97%     Last Pain:  Vitals:   07/03/21 0730  TempSrc: Oral  PainSc: 2    Pain Goal: Patients Stated Pain Goal: 3 (07/03/21 0730)                 Jaja Switalski

## 2021-07-03 NOTE — Lactation Note (Signed)
This note was copied from a baby's chart. Lactation Consultation Note  Patient Name: Sharon Jimenez Date: 07/03/2021 Reason for consult: Follow-up assessment;Term Age:33 hours   Mom states infant fed recently.  Baby cueing in basinet.  With gloved suck assessment, infant clamped down.  When he did begin sucking, bottom gumline was felt on finger without good extension of the tongue.  Mom hand expressed and several drops were noted.  LC placed baby belly to belly and assisted with latching.  Infant has narrow gape and baby had multiple attempts before sustaining latch.  Mom used compression to get baby to begin sucking.  A few swallows were heard but mom did feel a pinching.  If not stimulated, baby would continue sucking on his own.    LC set up DEBP for mom.  Suggested pumping every 3 hours to stimulate and to collect milk to feed back to baby.  No voids since delivery.  LC suggested mom supplement with her breastmilk if able to collect or formula.    LC observed mom pumping for a few minutes.  Size 24 flange used and vacuum to 2 drops for mom's comfort.  NP at bedside assessing suck and bottle feeding with infant.    Maternal Data Has patient been taught Hand Expression?: Yes  Feeding Mother's Current Feeding Choice: Breast Milk  LATCH Score Latch: Repeated attempts needed to sustain latch, nipple held in mouth throughout feeding, stimulation needed to elicit sucking reflex.  Audible Swallowing: A few with stimulation  Type of Nipple: Everted at rest and after stimulation  Comfort (Breast/Nipple): Soft / non-tender  Hold (Positioning): Assistance needed to correctly position infant at breast and maintain latch.  LATCH Score: 7   Lactation Tools Discussed/Used Tools: Pump Breast pump type: Double-Electric Breast Pump Pump Education: Setup, frequency, and cleaning;Milk Storage Reason for Pumping: stimulate milk supply and collect milk to feed back to  infant Pumping frequency: encouraged every 3 hours of after BF or bottle feeding  Interventions Interventions: Breast feeding basics reviewed;Assisted with latch;Skin to skin;Breast massage;DEBP;Education  Discharge Pump: DEBP  Consult Status Consult Status: Follow-up Date: 07/04/21 Follow-up type: In-patient    [name] [facility] 07/03/2021, 5:37 PM

## 2021-07-03 NOTE — Lactation Note (Signed)
This note was copied from a baby's chart. Lactation Consultation Note  Patient Name: Sharon Jimenez [id number] Date: 07/03/2021 Reason for consult: Initial assessment Age:33 hours P2, term female infant. Mom latched infant on her left breast using the cross cradle hold infant latched for 10 minutes with depth and sustaining latch. Mom was taught hand expression and mom self expressed 3 mls of colostrum that was spoon fed to infant. Infant was still cuing, mom latched infant on her right breast  using the cross cradle hold position and infant was still breastfeeding when [facility] left the room. Mom made aware of O/P services, breastfeeding support groups, community resources, and our phone # for post-discharge questions.   Mom's plan:  1- Mom will continue to breastfeed infant according to feeding cues, 8 to 12 + or more times, skin to skin. 2- Mom will attempt to latch infant on both breast during a feeding. 3- Mom knows if she needs further assistance with latching infant at breast to ask RN/LC . 4- Mom knows to hand express and give infant back her EBM by spoon if infant will not latch at breast.   Maternal Data Has patient been taught Hand Expression?: Yes Does the patient have breastfeeding experience prior to this delivery?: Yes How long did the patient breastfeed?: Per mom, she only BF 1st child in hospital she had latch difficultes, her 1st child is currently 41 years old.  Feeding Mother's Current Feeding Choice: Breast Milk  LATCH Score Latch: Grasps breast easily, tongue down, lips flanged, rhythmical sucking.  Audible Swallowing: Spontaneous and intermittent  Type of Nipple: Everted at rest and after stimulation  Comfort (Breast/Nipple): Soft / non-tender  Hold (Positioning): Assistance needed to correctly position infant at breast and maintain latch.  LATCH Score: 9   Lactation Tools Discussed/Used    Interventions Interventions: Breast feeding basics reviewed;Assisted  with latch;Skin to skin;Breast compression;Adjust position;Support pillows;Position options;Education;LC Services brochure  Discharge    Consult Status Consult Status: Follow-up Date: 07/04/21 Follow-up type: In-patient    Danelle Earthly 07/03/2021, 1:14 AM

## 2021-07-03 NOTE — Progress Notes (Signed)
Sharon Jimenez  Post Partum Day 1:S/P VBAC  Subjective: Patient up ad lib, denies syncope or dizziness. Reports consuming regular diet without issues and denies N/V. Denies issues with urination and reports bleeding is "scant."  Patient is breastfeeding and reports going well.  Undecided postpartum contraception.  Pain is being appropriately managed with use of ibuprofen.  Objective: Vitals:   07/02/21 1810 07/02/21 1931 07/02/21 2333 07/03/21 0326  BP: 129/87 123/77 112/65 118/70  Pulse: 66 96 94 90  Resp: 17 20 18 17   Temp: 100.2 F (37.9 C) 98.3 F (36.8 C) 98.3 F (36.8 C) 97.9 F (36.6 C)  TempSrc: Oral Oral Oral Oral  SpO2: 100% 100% 96% 97%  Weight:      Height:       Recent Labs    07/02/21 0512  HGB 12.4  HCT 36.4    Physical Exam:  General: cooperative, appears stated age, and no distress Mood/Affect: appropriate Lungs: clear to auscultation, no wheezes, rales or rhonchi, symmetric air entry, no tachypnea, retractions or cyanosis.  Heart: normal rate, regular rhythm, normal S1, S2, no murmurs, rubs, clicks or gallops. Breast: not indicated. Abdomen:  + bowel sounds, soft, non-tender Uterine Fundus: firm @ U Lochia: appropriate Laceration: left periurethral-intact Skin: Warm, Dry DVT Evaluation: No cords or calf tenderness. No significant calf/ankle edema.  Assessment/Plan: S/P Vaginal Delivery-Day 1, doing well. Normal Involution.  Continue routine postpartum care. 2.Breastfeeding-going well 3. Contraception: discussed optimal birth spacing, pt desires depo-provera. 4. Plans for d/c tomorrow, desires circ-baby has not voided at this time.    Dr. 08/30/21 to be updated on patient status   Gloris Manchester, MSN, CNM 07/03/2021, 8:02 AM

## 2021-07-04 ENCOUNTER — Other Ambulatory Visit (HOSPITAL_COMMUNITY): Payer: Self-pay

## 2021-07-04 MED ORDER — MEDROXYPROGESTERONE ACETATE 150 MG/ML IM SUSP
150.0000 mg | Freq: Once | INTRAMUSCULAR | Status: AC
  Administered 2021-07-04: 150 mg via INTRAMUSCULAR
  Filled 2021-07-04: qty 1

## 2021-07-04 MED ORDER — IBUPROFEN 600 MG PO TABS
600.0000 mg | ORAL_TABLET | Freq: Four times a day (QID) | ORAL | 0 refills | Status: AC | PRN
  Filled 2021-07-04: qty 40, 10d supply, fill #0

## 2021-07-04 MED ORDER — ACETAMINOPHEN 500 MG PO TABS
1000.0000 mg | ORAL_TABLET | Freq: Three times a day (TID) | ORAL | 0 refills | Status: AC | PRN
Start: 2021-07-04 — End: ?
  Filled 2021-07-04: qty 60, 10d supply, fill #0

## 2021-07-04 NOTE — Lactation Note (Signed)
This note was copied from a baby's chart. Lactation Consultation Note  Patient Name: Sharon Jimenez [id number] Date: 07/04/2021 Reason for consult: Follow-up assessment;Term Age:33 hours  LC in to room prior to discharge. Baby is sleeping. Mother reports good feedings . Mother explains there is discomfort only when baby has a shallow latch. Talked about ways to stimulate a wide gape. Discussed normal behavior and patterns after 24h, voids and stools as signs good intake, pumping, clusterfeeding, skin to skin. Talked about milk coming into volume and managing engorgement.   Plan: 1-Aim for a deep, comfortable latch, breastfeeding on demand or 8-12 times in 24h period. 2-Hand express/pump as needed for supplementation 3-Encouraged maternal rest, hydration and food intake.   Contact LC as needed for feeds/support/concerns/questions. All questions answered at this time. Reviewed LC brochure.     Feeding Mother's Current Feeding Choice: Breast Milk  Lactation Tools Discussed/Used Tools: Pump Breast pump type: Double-Electric Breast Pump;Manual  Interventions Interventions: Breast feeding basics reviewed;Skin to skin;DEBP;Hand pump;Expressed milk;Coconut oil;Education;LC Services brochure  Discharge Discharge Education: Engorgement and breast care;Warning signs for feeding baby Pump: Manual;DEBP;Personal WIC Program: Yes  Consult Status Consult Status: Complete Date: 07/04/21 Follow-up type: Call as needed    Myrl Bynum A Higuera Ancidey 07/04/2021, 11:09 AM

## 2021-07-04 NOTE — Progress Notes (Addendum)
Sharon Jimenez  Post Partum Day 2:S/P VBAC  Subjective: Patient up ad lib, denies syncope or dizziness. Reports consuming regular diet without issues and denies N/V. Denies issues with urination, bleeding remains "scant to small."  Patient is breastfeeding and reports going well, seen LC and milk "coming in slow" as result she bottle feed baby last night due to no NB voids.  NB voiding last night at 9 pm.  She would like depo-provera for contraception.  Pain is being appropriately managed with use of ibuprofen.  She would like to go home today.   Objective: Vitals:   07/03/21 0730 07/03/21 1501 07/03/21 1900 07/04/21 0608  BP: 116/70 121/79 123/75 122/80  Pulse: 79 88 90 81  Resp: 18 18 17 16   Temp: 98.2 F (36.8 C) 98.1 F (36.7 C) 97.9 F (36.6 C) 98.2 F (36.8 C)  TempSrc: Oral  Oral Oral  SpO2:  99%    Weight:      Height:       Recent Labs    07/02/21 0512  HGB 12.4  HCT 36.4    Physical Exam:  General: cooperative, appears stated age, and no distress Mood/Affect: appropriate Lungs: clear to auscultation, no wheezes, rales or rhonchi, symmetric air entry, no tachypnea, retractions or cyanosis.  Heart: normal rate, regular rhythm, normal S1, S2, no murmurs, rubs, clicks or gallops. Breast: not indicated. Abdomen:  + bowel sounds, soft, non-tender Uterine Fundus: firm, 1 FB below umbilicus Lochia: appropriate Laceration: left periurethral-intact Skin: Warm, Dry DVT Evaluation: No cords or calf tenderness. No significant calf/ankle edema.  Assessment/Plan: S/P Vaginal Delivery-Day 2, doing well and desires d/c today. Normal Involution.  Continue routine postpartum care.  PP education provided, and pt knows when to call the office.  2.Breastfeeding-going well 3. Contraception: discussed optimal birth spacing, pt desires depo-provera. 4. 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 Denies SI/HI today, ready to go home to her other son.  Plan 2 week PP visit for mood check.   5. Plans for d/c today, desires circ-baby-NBM voided last night.     Dr. Manus Gunning, CNM to be updated on patient status   Olga Coaster, MSN, CNM 07/04/2021, 7:45 AM   I personally saw and evaluated the patient, performing the key elements of the service. I developed and verified the management plan that is described in the resident's/student's note, and I agree with the content with my edits above. VSS, HRR&R, Resp unlabored, Legs neg.  Nigel Berthold, CNM 07/10/2021 9:21 AM

## 2021-07-07 ENCOUNTER — Telehealth (HOSPITAL_COMMUNITY): Payer: Self-pay | Admitting: *Deleted

## 2021-07-07 DIAGNOSIS — Z1331 Encounter for screening for depression: Secondary | ICD-10-CM

## 2021-07-07 NOTE — Telephone Encounter (Signed)
SW Inpatient Referral for EPDS=11. Dr. Crissie Reese notified via chart.  Duffy Rhody, RN 07-07-2021 at 1:55pm

## 2021-07-10 ENCOUNTER — Other Ambulatory Visit: Payer: Self-pay

## 2021-07-10 ENCOUNTER — Ambulatory Visit (INDEPENDENT_AMBULATORY_CARE_PROVIDER_SITE_OTHER): Payer: Medicaid Other

## 2021-07-10 DIAGNOSIS — Z0130 Encounter for examination of blood pressure without abnormal findings: Secondary | ICD-10-CM

## 2021-07-10 NOTE — Progress Notes (Signed)
Subjective:  Sharon Jimenez is a 31 y.o. female here for BP check.   Hypertension ROS::Patient is not currently prescribed to take BP medication at this time. Patient denies having any headache, swelling, or visual disturbances.   Objective:  LMP 09/15/2020 (Within Days)   Appearance alert, well appearing, and in no distress. General exam BP noted to be well controlled today in office.    Assessment:   Blood Pressure  Patient advised to check BP at home and report increased readings long with headaches, swelling, and visual disturbances .   Plan:  Follow up at postpartum unless sx change. Marland Kitchen

## 2021-07-16 ENCOUNTER — Ambulatory Visit (INDEPENDENT_AMBULATORY_CARE_PROVIDER_SITE_OTHER): Payer: 59 | Admitting: Licensed Clinical Social Worker

## 2021-07-16 DIAGNOSIS — F53 Postpartum depression: Secondary | ICD-10-CM

## 2021-07-18 NOTE — BH Specialist Note (Signed)
Integrated Behavioral Health via Telemedicine Visit  07/18/2021 Sharon Jimenez [id number]  Number of Integrated Behavioral Health Clinician visits: 1 Session Start time: 1:00pm  Session End time: 1:20pm Total time in minutes: 20 mins via mychart video  Referring Provider: Hospital discharge  Patient/Family location: Home  [facility] Provider location: [facility]  All persons participating in visit: Pt T Modi and LCSW A. Tyriek Hofman  Types of Service: Individual psychotherapy and Video visit  I connected with Orland Dec and/or Bunnie Pion Lippman's n/a via  Telephone or Video Enabled Telemedicine Application  (Video is Caregility application) and verified that I am speaking with the correct person using two identifiers. Discussed confidentiality: Yes   I discussed the limitations of telemedicine and the availability of in person appointments.  Discussed there is a possibility of technology failure and discussed alternative modes of communication if that failure occurs.  I discussed that engaging in this telemedicine visit, they consent to the provision of behavioral healthcare and the services will be billed under their insurance.  Patient and/or legal guardian expressed understanding and consented to Telemedicine visit: Yes   Presenting Concerns: Patient and/or family reports the following symptoms/concerns: postpartum depression Duration of problem: approx 5 months; Severity of problem: mild  Patient and/or Family's Strengths/Protective Factors: Concrete supports in place (healthy food, safe environments, etc.)  Goals Addressed: Patient will:  Reduce symptoms of: depression   Increase knowledge and/or ability of: coping skills   Demonstrate ability to: Increase adequate support systems for patient/family  Progress towards Goals: Ongoing  Interventions: Interventions utilized:  Supportive Counseling Standardized Assessments completed: Inocente Salles Postnatal Depression  Patient  and/or Family Response: Ms. Foody responded well to mychart visit   Assessment: Patient currently experiencing postpartum depression.   Patient may benefit from integrated behavioral health.  Plan: Follow up with behavioral health clinician on : 08/12/2021 Behavioral recommendations: Prioritize rest, develop routine schedule with newborn to prevent burnout, communicate needs with supportive person or partner for additional support  Referral(s): Integrated [employer] (In Clinic)  I discussed the assessment and treatment plan with the patient and/or parent/guardian. They were provided an opportunity to ask questions and all were answered. They agreed with the plan and demonstrated an understanding of the instructions.   They were advised to call back or seek an in-person evaluation if the symptoms worsen or if the condition fails to improve as anticipated.  Gwyndolyn Saxon, LCSW

## 2021-08-12 ENCOUNTER — Ambulatory Visit (INDEPENDENT_AMBULATORY_CARE_PROVIDER_SITE_OTHER): Payer: 59 | Admitting: Licensed Clinical Social Worker

## 2021-08-12 ENCOUNTER — Other Ambulatory Visit: Payer: Self-pay

## 2021-08-12 ENCOUNTER — Ambulatory Visit (INDEPENDENT_AMBULATORY_CARE_PROVIDER_SITE_OTHER): Payer: 59 | Admitting: Obstetrics and Gynecology

## 2021-08-12 DIAGNOSIS — Z1331 Encounter for screening for depression: Secondary | ICD-10-CM | POA: Diagnosis not present

## 2021-08-12 DIAGNOSIS — F53 Postpartum depression: Secondary | ICD-10-CM

## 2021-08-12 DIAGNOSIS — O165 Unspecified maternal hypertension, complicating the puerperium: Secondary | ICD-10-CM | POA: Insufficient documentation

## 2021-08-12 MED ORDER — NIFEDIPINE ER OSMOTIC RELEASE 30 MG PO TB24: 30.0000 mg | ORAL_TABLET | Freq: Every day | ORAL | 2 refills | Status: AC

## 2021-08-12 MED ORDER — SERTRALINE HCL 50 MG PO TABS: 50.0000 mg | ORAL_TABLET | Freq: Every day | ORAL | 3 refills | Status: DC

## 2021-08-12 NOTE — Progress Notes (Signed)
? ? ?Post Partum Visit Note ? ?Sharon Jimenez is a 31 y.o. [id number] female who presents for a postpartum visit. She is 6 weeks postpartum following a normal spontaneous vaginal delivery.  I have fully reviewed the prenatal and intrapartum course. The delivery was at 40.4 gestational weeks.  Anesthesia: epidural. Postpartum course has been uncomplicated. Baby is doing well yes. Baby is feeding by breast. Bleeding staining only. Bowel function is normal. Bladder function is normal. Patient is not sexually active. Contraception method is Depo-Provera injections. Postpartum depression screening: positive. ?*Edinburgh=23* ? ?The pregnancy intention screening data noted above was reviewed. Potential methods of contraception were discussed. The patient elected to proceed with continued depo provera. ? ?Pt has elevated blood pressure today, but denies current headache, visual changes or RUQ pain.  Discussed elevated Edinburgh scale.  Pt states she has had some trouble adjusting to the new baby and is somewhat overwhelmed, including some breastfeeding difficulties.  Pt became slightly tearful during this discussion ? ? Edinburgh Postnatal Depression Scale - 08/12/21 1325   ? ?  ? Edinburgh Postnatal Depression Scale:  In the Past 7 Days  ? I have been able to laugh and see the funny side of things. 3   ? I have looked forward with enjoyment to things. 3   ? I have blamed myself unnecessarily when things went wrong. 2   ? I have been anxious or worried for no good reason. 3   ? I have felt scared or panicky for no good reason. 3   ? Things have been getting on top of me. 3   ? I have been so unhappy that I have had difficulty sleeping. 2   ? I have felt sad or miserable. 2   ? I have been so unhappy that I have been crying. 2   ? The thought of harming myself has occurred to me. 0   ? Edinburgh Postnatal Depression Scale Total 23   ? ?  ?  ? ?  ? ? ?Health Maintenance Due  ?Topic Date Due  ? COVID-19 Vaccine (1) Never done   ? ? ?The following portions of the patient's history were reviewed and updated as appropriate: allergies, current medications, past family history, past medical history, past social history, past surgical history, and problem list. ? ?Review of Systems ?Pertinent items are noted in HPI. ? ?Objective:  ?BP (!) 149/108   Pulse 94   Ht 5\' 5"  (1.651 m)   Wt 160 lb 11.2 oz (72.9 kg)   LMP 09/15/2020 (Within Days)   Breastfeeding Yes   BMI 26.74 kg/m?   ? ?General:  alert, cooperative, and no distress  ? Breasts:  not indicated  ?Lungs: clear to auscultation bilaterally  ?Heart:  regular rate and rhythm  ?Abdomen: soft, non-tender; bowel sounds normal; no masses,  no organomegaly   ?Wound N/a  ?GU exam:  not indicated  ?     ?Assessment:  ? ? Encounter for postpartum exam ? ?Postpartum hypertension ?Postpartum depression ? ? ? ?Plan:  ? ?Essential components of care per ACOG recommendations: ? ?1.  Mood and well being: Patient with positive depression screening today. Reviewed local resources for support. Will start zoloft 50 mg /day.  Pt advised it will take about a month to start seeing improvement from the medication.  Pt will see behavorial health today. ?- Patient tobacco use? No.   ?- hx of drug use? No.   ? ?2. Infant care and feeding:  ?-  Patient currently breastmilk feeding? Yes. Reviewed importance of draining breast regularly to support lactation.  ?-Social determinants of health (SDOH) reviewed in EPIC. The following needs were identified: mental health/depression ? ?3. Sexuality, contraception and birth spacing ?- Patient does not want a pregnancy in the next year.  Desired family size is 2 children.  ?- Reviewed reproductive life planning. Reviewed contraceptive methods based on pt preferences and effectiveness.  Patient desired Hormonal Injection today.   ?- Discussed birth spacing of 18 months ? ?4. Sleep and fatigue ?-Encouraged family/partner/community support of 4 hrs of uninterrupted sleep to  help with mood and fatigue ? ?5. Physical Recovery  ?- Discussed patients delivery and complications. She describes her labor as good. ?- Patient had a Vaginal, no problems at delivery. Patient had   periuretral tears . Perineal healing reviewed. Patient expressed understanding ?- Patient has urinary incontinence? No. ?- Patient is safe to resume physical and sexual activity ? ?6.  Health Maintenance ?- HM due items addressed Yes ?- Last pap smear  ?Diagnosis  ?Date Value Ref Range Status  ?11/28/2020   Final  ? - Negative for Intraepithelial Lesions or Malignancy (NILM)  ?11/28/2020 - Benign reactive/reparative changes  Final  ? Pap smear not done at today's visit.  ?-Breast Cancer screening indicated? No.  ? ?7. Chronic Disease/Pregnancy Condition follow up: Hypertension and depression/anxiety ?Will start procardia XL 30 mg, check preeclampsia labs, BP check in 1 week ?Start zoloft 50 mg po daily ? ?- PCP follow up ? ?Warden Fillers, MD ?Center for [employer], [facility]  ?

## 2021-08-13 LAB — COMPREHENSIVE METABOLIC PANEL
ALT: 14 IU/L (ref 0–32)
AST: 16 IU/L (ref 0–40)
Albumin/Globulin Ratio: 1.9 (ref 1.2–2.2)
Albumin: 4.7 g/dL (ref 3.8–4.8)
Alkaline Phosphatase: 100 IU/L (ref 44–121)
BUN/Creatinine Ratio: 20 (ref 9–23)
BUN: 14 mg/dL (ref 6–20)
Bilirubin Total: 0.2 mg/dL (ref 0.0–1.2)
CO2: 20 mmol/L (ref 20–29)
Calcium: 9.5 mg/dL (ref 8.7–10.2)
Chloride: 104 mmol/L (ref 96–106)
Creatinine, Ser: 0.71 mg/dL (ref 0.57–1.00)
Globulin, Total: 2.5 g/dL (ref 1.5–4.5)
Glucose: 79 mg/dL (ref 70–99)
Potassium: 4.5 mmol/L (ref 3.5–5.2)
Sodium: 141 mmol/L (ref 134–144)
Total Protein: 7.2 g/dL (ref 6.0–8.5)
eGFR: 116 mL/min/{1.73_m2} (ref >59)

## 2021-08-13 LAB — CBC
Hematocrit: 40 % (ref 34.0–46.6)
Hemoglobin: 12.9 g/dL (ref 11.1–15.9)
MCH: 31.2 pg (ref 26.6–33.0)
MCHC: 32.3 g/dL (ref 31.5–35.7)
MCV: 97 fL (ref 79–97)
Platelets: 336 10*3/uL (ref 150–450)
RBC: 4.14 x10E6/uL (ref 3.77–5.28)
RDW: 12.4 % (ref 11.7–15.4)
WBC: 11.8 10*3/uL — ABNORMAL HIGH (ref 3.4–10.8)

## 2021-08-13 NOTE — BH Specialist Note (Signed)
Integrated Behavioral Health Follow Up In-Person Visit ? ?MRN: [id number] ?Name: Sharon Jimenez ? ?Number of Integrated Behavioral Health Clinician visits: 1- Initial Visit ? ?Session Start time: 0203 ?  ?Session End time: 0230 ? ?Total time in minutes: 27 ? ? ?Types of Service: Individual psychotherapy ? ?Interpretor:No. Interpretor Name and Language: none ? ?Subjective: ?Sharon Jimenez is a 31 y.o. female accompanied by n/a ?Patient was referred by Dr. Donavan Foil for postpartum depression. ?Patient reports the following symptoms/concerns: depressed mood, crying, difficulty sleeping, overthinking, self doubt, fatigue  ?Duration of problem: approx 2 months ; Severity of problem: mild ? ?Objective: ?Mood: Depressed and Affect: Depressed ?Risk of harm to self or others: No plan to harm self or others ? ?Life Context: ?Family and Social: Lives with partner in [place]  ?School/Work: Full time work at home  ?Self-Care: n/a ?Life Changes: Newborn ? ?Patient and/or Family's Strengths/Protective Factors: ?Concrete supports in place (healthy food, safe environments, etc.) ? ?Goals Addressed: ?Patient will: ? Reduce symptoms of: depression, insomnia, and stress  ? Increase knowledge and/or ability of: coping skills and stress reduction  ? Demonstrate ability to: Increase healthy adjustment to current life circumstances and Increase adequate support systems for patient/family ? ?Progress towards Goals: ?Ongoing ? ?Interventions: ?Interventions utilized:  CBT Cognitive Behavioral Therapy and Supportive Counseling ?Standardized Assessments completed: Edinburgh Postnatal Depression ? ? ?Patient Centered Plan: ?Patient is on the following Treatment Plan(s): Sharon Jimenez was prescribed zoloft by Dr. Donavan Foil. Sharon Jimenez expressed full understanding further medication management should be handled by primary care or behavioral health provider.  ? ?Assessment: ?Patient currently experiencing postpartum depression.  ? ?Patient may  benefit from integrated behavioral health. ? ?Plan: ?Follow up with behavioral health clinician on : 08/26/2021 ?Behavioral recommendations: Intentionally schedule time for self care, prioritize rest, take prescribed medication, engage in relaxation techniques  ?Referral(s): Integrated [employer] (In Clinic) ? ?Gwyndolyn Saxon, LCSW ? ? ?

## 2021-08-14 LAB — PROTEIN / CREATININE RATIO, URINE
Creatinine, Urine: 69 mg/dL
Protein, Ur: 12.3 mg/dL
Protein/Creat Ratio: 178 mg/g creat (ref 0–200)

## 2021-08-19 ENCOUNTER — Ambulatory Visit (INDEPENDENT_AMBULATORY_CARE_PROVIDER_SITE_OTHER): Payer: 59

## 2021-08-19 ENCOUNTER — Ambulatory Visit: Payer: 59

## 2021-08-19 ENCOUNTER — Other Ambulatory Visit: Payer: Self-pay

## 2021-08-19 VITALS — BP 144/93 | HR 93

## 2021-08-19 DIAGNOSIS — Z0130 Encounter for examination of blood pressure without abnormal findings: Secondary | ICD-10-CM

## 2021-08-19 NOTE — Progress Notes (Signed)
..  Subjective:  ?Sharon Jimenez is a 31 y.o. female here for BP check.  ? ?Hypertension ROS: taking medications as instructed, no medication side effects noted, no TIA's, no chest pain on exertion, no dyspnea on exertion, and no swelling of ankles.  ? ? ?Objective:  ?BP (!) 144/93   Pulse 93   LMP 09/15/2020 (Within Days)   ?Appearance alert, well appearing, and in no distress. ?General exam BP noted to be well controlled today in office.  ? ? ?Assessment:   ?Blood Pressure improved.  ? ?Plan:  ?Advised pt that  Per Dr. Roselie Awkward she should increase Procardia from 30mg  to 60mg  Daily and follow up with PCP.  Pt has BP cuff at home, provided pt with PCP list, and advised that if abnormal symptoms should occur to follow up or be seen at hospital, pt agreed. ? ?

## 2021-08-19 NOTE — Progress Notes (Signed)
Patient was assessed and managed by nursing staff during this encounter. I have reviewed the chart and agree with the documentation and plan. I have also made any necessary editorial changes. ? ?Emeterio Reeve, MD ?08/19/2021 5:01 PM  ? ?

## 2021-08-26 ENCOUNTER — Ambulatory Visit (INDEPENDENT_AMBULATORY_CARE_PROVIDER_SITE_OTHER): Payer: 59 | Admitting: Licensed Clinical Social Worker

## 2021-08-26 DIAGNOSIS — Z1331 Encounter for screening for depression: Secondary | ICD-10-CM

## 2021-08-26 DIAGNOSIS — F53 Postpartum depression: Secondary | ICD-10-CM

## 2021-08-26 NOTE — BH Specialist Note (Signed)
Integrated Behavioral Health Follow Up In-Person Visit ? ?MRN: [id number] ?Name: Sharon Jimenez ? ?Number of [facility] Clinician visits: 2- Second Visit ? ?Session Start time: [id number] ?  ?Session End time: [id number] ? ?Total time in minutes: 24 ? ? ?Types of Service: Individual psychotherapy ? ?Interpretor:No. Interpretor Name and Language: none ? ?Subjective: ?Sharon Jimenez is a 31 y.o. female accompanied by n/a ?Patient was referred by Dr. Elgie Congo for postpartum depression . ?Patient reports the following symptoms/concerns: tearful, anxious ,nervous , depressed mood, feeling hopeless and loss of interest.  ?Duration of problem: approx 2 months ; Severity of problem: mild ? ?Objective: ?Mood: Depressed and Hopeless and Affect: Depressed ?Risk of harm to self or others: No plan to harm self or others ? ?Life Context: ?Family and Social: Lives with partner and children  ?School/Work: [employer] ?Self-Care: n/a ?Life Changes: Newborn  ? ?Patient and/or Family's Strengths/Protective Factors: ?Concrete supports in place (healthy food, safe environments, etc.) ? ?Goals Addressed: ?Patient will: ? Reduce symptoms of: depression  ? Increase knowledge and/or ability of: coping skills and healthy habits  ? Demonstrate ability to: Increase adequate support systems for patient/family ? ?Progress towards Goals: ?Ongoing ? ?Interventions: ?Interventions utilized:  CBT Cognitive Behavioral Therapy and Supportive Counseling ?Standardized Assessments completed: Edinburgh Postnatal Depression ? ?Patient and/or Family Response: Ms Harne responded well to visit ? ? ?Assessment: ?Patient currently experiencing postpartum depression.  ? ?Patient may benefit from integrated behavioral health. ? ?Plan: ?Follow up with behavioral health clinician on : 09/16/2021 ?Behavioral recommendations: Ms. Reimold is experiencing postpartum depression. Ms. Dangelo presents tearful and very depressed during visit. Ms. Stokke reports she is  currently talking zoloft. Ms. Schmied is cluster breastfeeding and has difficulty sleeping and producing milk. It is recommeding Ms. Molina have additional time if allowed by employer to manage her postpartum depression  ?Referral(s): [facility] (In Clinic) ?"From scale of 1-10, how likely are you to follow plan?":   ? ?Lynnea Ferrier, LCSW ? ? ?

## 2021-09-04 ENCOUNTER — Other Ambulatory Visit: Payer: Self-pay | Admitting: Obstetrics and Gynecology

## 2021-09-04 DIAGNOSIS — F53 Postpartum depression: Secondary | ICD-10-CM

## 2021-09-16 ENCOUNTER — Other Ambulatory Visit: Payer: Self-pay | Admitting: Obstetrics and Gynecology

## 2021-09-16 ENCOUNTER — Encounter: Payer: Self-pay | Admitting: Obstetrics and Gynecology

## 2021-09-16 ENCOUNTER — Ambulatory Visit (INDEPENDENT_AMBULATORY_CARE_PROVIDER_SITE_OTHER): Payer: 59 | Admitting: Licensed Clinical Social Worker

## 2021-09-16 ENCOUNTER — Telehealth: Payer: Self-pay

## 2021-09-16 DIAGNOSIS — F53 Postpartum depression: Secondary | ICD-10-CM | POA: Diagnosis not present

## 2021-09-16 MED ORDER — SERTRALINE HCL 100 MG PO TABS: 100.0000 mg | ORAL_TABLET | Freq: Every day | ORAL | 1 refills | Status: AC

## 2021-09-16 NOTE — Telephone Encounter (Signed)
Spoke with patient to discuss continuation of leave due to postpartum depression and anxiety. Patient states that she has a follow up appt with Behavioral Health today and will discuss time frame in which patient may return to work. Advised patient to discuss with Sue Lush today, so that we can complete paperwork.  ?

## 2021-09-16 NOTE — BH Specialist Note (Cosign Needed Addendum)
Integrated Behavioral Health Follow Up In-Person Visit ? ?MRN: [id number] ?Name: Sharon Jimenez ? ?Number of [facility] Clinician visits: 2- Second Visit ? ?Session Start time: 1330 ?  ?Session End time: [id number] ? ?Total time in minutes: 45 ?In person at [facility]  ? ?Types of Service: Individual psychotherapy ? ?Interpretor:No. Interpretor Name and Language: None  ? ?Subjective: ?Sharon Jimenez is a 31 y.o. female accompanied by n/a ?Patient reports the following symptoms/concerns: Postpartum depression ?Duration of problem: approx 2 months ; Severity of problem: moderate ? ?Objective: ?Mood: Depressed and Affect: Tearful ?Risk of harm to self or others: No plan to harm self or others ? ?Life Context: ?Family and Social: Lives with partner and children in [place]  ?School/Work: [employer]  ?Self-Care: n/a ?Life Changes: Newborn son  ? ?Patient and/or Family's Strengths/Protective Factors: ?Concrete supports in place (healthy food, safe environments, etc.) ? ?Goals Addressed: ?Patient will: ? Reduce symptoms of: depression  ? Increase knowledge and/or ability of: coping skills and self-management skills  ? Demonstrate ability to: Increase healthy adjustment to current life circumstances and Increase adequate support systems for patient/family ? ?Progress towards Goals: ?Ongoing ? ?Interventions: ?Interventions utilized:  Optician, dispensing and Supportive Counseling ?Standardized Assessments completed: Edinburgh Postnatal Depression ? ?Patient and/or Family Response: Sharon Jimenez is tearful during visit  ? ?Assessment: ?Patient currently experiencing postpartum depression.  ? ?Patient may benefit from integrated behavioral health. ? ?Plan: ?Follow up with behavioral health clinician on : 3 weeks via mychart ?Behavioral recommendations: Sharon Jimenez reports she is currently taking zoloft as directed. Sharon Jimenez has a positive edinburgh depression scale is 22. LCSW Sharon Jimenez recommends  Sharon Jimenez prioritize rest due to impaired sleeping patterns. communicate needs with partner for added support, engage in self care and mindfulness. Zoloft increased to 100mg . ?Referral(s): Sharon Jimenez (In Clinic) ?"From scale of 1-10, how likely are you to follow plan?":   ? ?Sharon Ferrier, LCSW ? ? ?

## 2021-09-17 ENCOUNTER — Encounter: Payer: Self-pay | Admitting: Obstetrics and Gynecology

## 2021-09-18 ENCOUNTER — Telehealth: Payer: Self-pay

## 2021-09-18 NOTE — Telephone Encounter (Signed)
Pt was contacted by Sue Lush (Child psychotherapist) who informed patient of the provider recommendations and a referral to psychiatry was placed. ?

## 2021-09-18 NOTE — Addendum Note (Signed)
Addended by: Lynnea Ferrier on: 09/18/2021 02:10 PM ? ? Modules accepted: Orders ? ?
